# Patient Record
Sex: Male | Born: 1970
Health system: Southern US, Community
[De-identification: ages and names within clinical notes are randomized; demographics above are authoritative.]

## PROBLEM LIST (undated history)

## (undated) DIAGNOSIS — A6000 Herpesviral infection of urogenital system, unspecified: Secondary | ICD-10-CM

## (undated) DIAGNOSIS — F411 Generalized anxiety disorder: Secondary | ICD-10-CM

## (undated) HISTORY — PX: BACK SURGERY: SHX140

## (undated) HISTORY — PX: TONSILLECTOMY: SUR1361

## (undated) HISTORY — DX: Herpesviral infection of urogenital system, unspecified: A60.00

## (undated) HISTORY — PX: OTHER SURGICAL HISTORY: SHX169

## (undated) HISTORY — DX: Generalized anxiety disorder: F41.1

---

## 2005-12-03 ENCOUNTER — Emergency Department (HOSPITAL_COMMUNITY): Admission: EM | Admit: 2005-12-03 | Discharge: 2005-12-03 | Payer: Self-pay | Admitting: *Deleted

## 2006-11-25 ENCOUNTER — Emergency Department (HOSPITAL_COMMUNITY): Admission: EM | Admit: 2006-11-25 | Discharge: 2006-11-26 | Payer: Self-pay | Admitting: Emergency Medicine

## 2007-01-24 ENCOUNTER — Ambulatory Visit (HOSPITAL_COMMUNITY): Admission: RE | Admit: 2007-01-24 | Discharge: 2007-01-24 | Payer: Self-pay | Admitting: Gastroenterology

## 2007-01-24 ENCOUNTER — Encounter (INDEPENDENT_AMBULATORY_CARE_PROVIDER_SITE_OTHER): Payer: Self-pay | Admitting: Gastroenterology

## 2007-03-19 ENCOUNTER — Encounter: Admission: RE | Admit: 2007-03-19 | Discharge: 2007-03-19 | Payer: Self-pay | Admitting: Gastroenterology

## 2007-06-25 ENCOUNTER — Ambulatory Visit: Payer: Self-pay | Admitting: Internal Medicine

## 2007-08-04 ENCOUNTER — Ambulatory Visit (HOSPITAL_COMMUNITY): Admission: RE | Admit: 2007-08-04 | Discharge: 2007-08-04 | Payer: Self-pay | Admitting: Gastroenterology

## 2007-08-29 ENCOUNTER — Ambulatory Visit: Payer: Self-pay | Admitting: Internal Medicine

## 2007-08-29 DIAGNOSIS — F411 Generalized anxiety disorder: Secondary | ICD-10-CM | POA: Insufficient documentation

## 2007-10-09 ENCOUNTER — Ambulatory Visit: Payer: Self-pay | Admitting: Internal Medicine

## 2007-12-05 ENCOUNTER — Encounter: Payer: Self-pay | Admitting: Internal Medicine

## 2008-03-18 ENCOUNTER — Encounter: Payer: Self-pay | Admitting: Internal Medicine

## 2008-04-08 ENCOUNTER — Ambulatory Visit: Payer: Self-pay | Admitting: Internal Medicine

## 2008-06-14 ENCOUNTER — Telehealth: Payer: Self-pay | Admitting: Internal Medicine

## 2008-11-08 ENCOUNTER — Ambulatory Visit: Payer: Self-pay | Admitting: Internal Medicine

## 2008-12-13 ENCOUNTER — Ambulatory Visit: Payer: Self-pay | Admitting: Internal Medicine

## 2009-11-14 ENCOUNTER — Telehealth: Payer: Self-pay | Admitting: Internal Medicine

## 2009-12-15 ENCOUNTER — Telehealth: Payer: Self-pay | Admitting: Internal Medicine

## 2009-12-27 ENCOUNTER — Ambulatory Visit: Payer: Self-pay | Admitting: Internal Medicine

## 2010-01-10 ENCOUNTER — Ambulatory Visit: Payer: Self-pay | Admitting: Internal Medicine

## 2010-01-10 DIAGNOSIS — R300 Dysuria: Secondary | ICD-10-CM | POA: Insufficient documentation

## 2010-01-10 DIAGNOSIS — A6000 Herpesviral infection of urogenital system, unspecified: Secondary | ICD-10-CM | POA: Insufficient documentation

## 2010-01-10 LAB — CONVERTED CEMR LAB
Bilirubin Urine: NEGATIVE
Blood in Urine, dipstick: NEGATIVE
Glucose, Urine, Semiquant: NEGATIVE
Ketones, urine, test strip: NEGATIVE
Nitrite: NEGATIVE
Urobilinogen, UA: 0.2
pH: 6

## 2010-03-14 NOTE — Assessment & Plan Note (Signed)
Summary: consult re: painful urination for a few days/cjr   Vital Signs:  Patient profile:   40 year old male Weight:      166 pounds Temp:     98.1 degrees F oral BP sitting:   120 / 80  (left arm) Cuff size:   regular  Vitals Entered By: Duard Brady LPN (January 10, 2010 3:47 PM) CC: c/o dysuria Is Patient Diabetic? No   CC:  c/o dysuria.  History of Present Illness: 40 year old patient who presents with a week and a half history of paroxysms of burning and tingling mainly in the right perineal area and also the right groin and right buttock area.  He has a 13 year history of herpes simplex and has very mild outbreaks  once or twice per year.  He states these symptoms are very similar to his herpetic outbreaks.  There have been no vesicles, ulcers or other lesions.  Usually his herpes resolved after just a day or so and he was concerned because of the duration of a week and a half.  He has never had pain in the right buttock area.  No history of STDs other than herpes simplex.  He has been in a monogamous relationship with his wife for 8 years.  Preventive Screening-Counseling & Management  Alcohol-Tobacco     Smoking Status: never  Allergies: 1)  ! Clindamycin Hcl (Clindamycin Hcl) 2)  ! Amoxicillin  Physical Exam  General:  Well-developed,well-nourished,in no acute distress; alert,appropriate and cooperative throughout examination Abdomen:  Bowel sounds positive,abdomen soft and non-tender without masses, organomegaly or hernias noted. Genitalia:  Testes bilaterally descended without nodularity, tenderness or masses. No scrotal masses or lesions. No penis lesions or urethral discharge. Skin:  Intact without suspicious lesions or rashes   Impression & Recommendations:  Problem # 1:  DYSURIA (ICD-788.1)  Orders: UA Dipstick w/o Micro (manual) ([address])  Problem # 2:  HERPES GENITALIS (ICD-054.10)  Complete Medication List: 1)  Citalopram Hydrobromide 40 Mg Tabs  (Citalopram hydrobromide) .... Take one tablet by mouth daily--needs office visit  Patient Instructions: 1)  Please schedule an appointment with your primary doctor in :   Orders Added: 1)  UA Dipstick w/o Micro (manual) [81002] 2)  Est. Patient Level III [[address]]    Laboratory Results   Urine Tests  Date/Time Received: January 10, 2010 3:56 PM  Date/Time Reported: January 10, 2010 3:56 PM   Routine Urinalysis   Color: yellow Appearance: Clear Glucose: negative   (Normal Range: Negative) Bilirubin: negative   (Normal Range: Negative) Ketone: negative   (Normal Range: Negative) Spec. Gravity: 1.010   (Normal Range: 1.003-1.035) Blood: negative   (Normal Range: Negative) pH: 6.0   (Normal Range: 5.0-8.0) Protein: negative   (Normal Range: Negative) Urobilinogen: 0.2   (Normal Range: 0-1) Nitrite: negative   (Normal Range: Negative) Leukocyte Esterace: trace   (Normal Range: Negative)

## 2010-03-14 NOTE — Progress Notes (Signed)
Summary: REFILL CITALOPRAM  Phone Note Refill Request Message from:  Patient on November 14, 2009 3:21 PM  Refills Requested: Medication #1:  CITALOPRAM HYDROBROMIDE 40 MG TABS take one tablet by mouth daily--NEEDS OFFICE VISIT.   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    Initial call taken by: Debbra Riding,  November 14, 2009 3:22 PM    Prescriptions: CITALOPRAM HYDROBROMIDE 40 MG TABS (CITALOPRAM HYDROBROMIDE) take one tablet by mouth daily--NEEDS OFFICE VISIT  #30 x 0   Entered by:   Kathrynn Speed CMA   Authorized by:   Birdie Sons MD   Signed by:   Kathrynn Speed CMA on 11/14/2009   Method used:   Electronically to        [facility] [address] [id number]* (retail)       [address]       [place], [place]  [address]       Ph: [phone]       Fax: [phone]   RxID:   [id number]

## 2010-03-14 NOTE — Assessment & Plan Note (Signed)
Summary: fu on med/njr   Vital Signs:  Patient profile:   40 year old male Weight:      161 pounds BMI:     23.86 Temp:     98.5 degrees F oral Pulse rate:   58 / minute BP sitting:   122 / 80  (left arm) Cuff size:   regular  Vitals Entered By: Alfred Levins, CMA (December 27, 2009 8:23 AM) CC: renew med   CC:  renew med.  History of Present Illness:  Follow-Up Visit      This is a 40 year old man who presents for Follow-up visit.  The patient denies chest pain and palpitations.  Since the last visit the patient notes no new problems or concerns.  The patient reports taking meds as prescribed.  When questioned about possible medication side effects, the patient notes none.  feels well on citalopram  All other systems reviewed and were negative   Current Medications (verified): 1)  Citalopram Hydrobromide 40 Mg Tabs (Citalopram Hydrobromide) .... Take One Tablet By Mouth Daily--Needs Office Visit  Allergies (verified): 1)  ! Clindamycin Hcl (Clindamycin Hcl) 2)  ! Amoxicillin  Past History:  Past Medical History: Last updated: 08/29/2007 GERD Anxiety  Past Surgical History: Last updated: 06/25/2007 Tonsillectomy  Family History: Last updated: 06/25/2007 father alive and well mother alive and well   Social History: Last updated: 06/25/2007 Married Never Smoked Regular exercise-yes  Risk Factors: Exercise: yes (06/25/2007)  Risk Factors: Smoking Status: never (06/25/2007)  Physical Exam  General:  well-developed male in no acute distress. HEENT exam atraumatic, normocephalic, neck supple without lymphadenopathy or femoral. Cardiac exam S1-S2 are regular.   Impression & Recommendations:  Problem # 1:  ANXIETY (ICD-300.00) doing well on meds continue current medications  His updated medication list for this problem includes:    Citalopram Hydrobromide 40 Mg Tabs (Citalopram hydrobromide) .Marland Kitchen... Take one tablet by mouth daily--needs office  visit  Complete Medication List: 1)  Citalopram Hydrobromide 40 Mg Tabs (Citalopram hydrobromide) .... Take one tablet by mouth daily--needs office visit Prescriptions: CITALOPRAM HYDROBROMIDE 40 MG TABS (CITALOPRAM HYDROBROMIDE) take one tablet by mouth daily--NEEDS OFFICE VISIT  #90 x 3   Entered and Authorized by:   Birdie Sons MD   Signed by:   Birdie Sons MD on 12/27/2009   Method used:   Electronically to        [facility] [address] [id number]* (retail)       [address]       [place], [place]  [address]       Ph: [phone]       Fax: [phone]   RxID:   [id number]    Orders Added: 1)  Est. Patient Level II [[address]]

## 2010-03-14 NOTE — Progress Notes (Signed)
Summary: Pt sch ov for 12/27/09. Pt req refill of Citalopram  Phone Note Call from Patient Call back at Home Phone [phone]   Caller: Patient Summary of Call: Pt called and has an appt sch for 12/27/09 to see Dr. Cato Mulligan. Pt rea refill of Citalopram called in to [facility] on [address] and [address].   Initial call taken by: Lucy Antigua,  December 15, 2009 9:04 AM  Follow-up for Phone Call        Phone Call Completed, Rx Called In Follow-up by: Alfred Levins, CMA,  December 15, 2009 1:04 PM    Prescriptions: CITALOPRAM HYDROBROMIDE 40 MG TABS (CITALOPRAM HYDROBROMIDE) take one tablet by mouth daily--NEEDS OFFICE VISIT  #30 x 0   Entered by:   Alfred Levins, CMA   Authorized by:   Birdie Sons MD   Signed by:   Alfred Levins, CMA on 12/15/2009   Method used:   Electronically to        [facility] [address] [id number]* (retail)       [address]       [place], [place]  [address]       Ph: [phone]       Fax: [phone]   RxID:   [id number]

## 2010-06-27 NOTE — Op Note (Signed)
Craig Benson, Craig Benson                   ACCOUNT NO.:  [id number]   MEDICAL RECORD NO.:  [id number]          PATIENT TYPE:  AMB   LOCATION:  ENDO                         FACILITY:  [facility]   PHYSICIAN:  Anselmo Rod, M.D.  DATE OF BIRTH:  02/13/1970   DATE OF PROCEDURE:  01/24/2007  DATE OF DISCHARGE:  01/24/2007                               OPERATIVE REPORT   PROCEDURE PERFORMED:  Esophagogastroduodenoscopy with esophageal  brushings and gastric biopsies.   ENDOSCOPIST:  Anselmo Rod, M.D.   INSTRUMENT USED:  Pentax video panendoscope.   INDICATIONS FOR PROCEDURE:  A 40 year old white male with a history of  epigastric pain not responding to PPIs, undergoing EGD to rule out  peptic ulcer disease, esophagitis, gastritis, etc.   PREPROCEDURE PREPARATION:  Informed consent was procured from the  patient.  The patient fasted for 8 hours prior to the procedure.  Risks  and benefits of the procedure including a 10% miss rate of cancer and  polyp were discussed with the patient as well.   PREPROCEDURE PHYSICAL:  VITAL SIGNS:  The patient had stable vital  signs.  NECK:  Supple.  CHEST:  Clear to auscultation.  HEART:  S1 and S2 regular.  ABDOMEN:  Soft with normal bowel sounds.   DESCRIPTION OF PROCEDURE:  The patient was placed in the left lateral  decubitus position and sedated with 100 mcg of Fentanyl and 6 mg of  Versed given intravenously in slow incremental doses.  Once the patient  was adequately sedated and maintained on low-flow oxygen and continuous  cardiac monitoring, the Pentax video panendoscope was advanced through  the mouthpiece over the tongue into the esophagus and under direct  vision.  The entire esophagus was widely patent. There were whitish  exudates noted in the distal esophagus above the Z-line which could  represent esophageal candidiasis.  Brushings were done from this area  and there was no stricture noted.  The scope was then advanced into the  stomach.  Diffuse gastritis was noted. Antral biopsies were done to rule  out presence of H. pylori by pathology.  The proximal small bowel  appeared normal.  There was no outlet obstruction.  The patient  tolerated the procedure well without complications.  Retroflexion in the  high cardia revealed no abnormalities.   IMPRESSION:  1. Widely patent esophagus.  2. Whitish exudates in distal esophagus.  Brushings done to rule out      Candidal esophagitis.  3. Diffuse moderate gastritis.  Antral biopsies done to rule out      Helicobacter pylori.  4. Normal proximal small bowel.   RECOMMENDATIONS:  1. Diflucan has been called into the patient's pharmacy for the next      10 days, 100 mg per day.  2. Await pathology results.  3. Avoid all nonsteroidals.  4. Outpatient follow-up in the next four weeks for further      recommendations.      Anselmo Rod, M.D.  Electronically Signed     JNM/MEDQ  D:  01/24/2007  T:  01/26/2007  Job:  [id number]   cc:   Timoteo Gaul, M.D.  [facility] [employer]

## 2010-11-23 LAB — I-STAT 8, (EC8 V) (CONVERTED LAB)
BUN: 12
Bicarbonate: 30.7 — ABNORMAL HIGH
Chloride: 102
Glucose, Bld: 108 — ABNORMAL HIGH
HCT: 49
Sodium: 138
TCO2: 32
pCO2, Ven: 49.8
pH, Ven: 7.399 — ABNORMAL HIGH

## 2010-11-23 LAB — DIFFERENTIAL
Basophils Absolute: 0
Basophils Relative: 0
Lymphocytes Relative: 20
Monocytes Absolute: 0.6
Monocytes Relative: 7

## 2010-11-23 LAB — URINALYSIS, ROUTINE W REFLEX MICROSCOPIC
Glucose, UA: NEGATIVE
Ketones, ur: NEGATIVE

## 2010-11-23 LAB — CBC
HCT: 46.7
Hemoglobin: 16.1
RBC: 5.23

## 2011-01-30 ENCOUNTER — Other Ambulatory Visit: Payer: Self-pay | Admitting: Internal Medicine

## 2011-01-30 MED ORDER — CITALOPRAM HYDROBROMIDE 40 MG PO TABS: 40.0000 mg | ORAL_TABLET | Freq: Every day | ORAL | Status: DC

## 2011-01-30 NOTE — Telephone Encounter (Signed)
rx sent in electronically 

## 2011-01-30 NOTE — Telephone Encounter (Signed)
Pt scheduled for a med follow up on 03/06/11 and needs refill on CITALOPRAM HYDROBROMIDE 40 MG TABS (CITALOPRAM HYDROBROMIDE)  [facility] [address] and [address]

## 2011-03-06 ENCOUNTER — Ambulatory Visit: Payer: Self-pay | Admitting: Internal Medicine

## 2011-03-16 ENCOUNTER — Ambulatory Visit: Payer: Self-pay | Admitting: Internal Medicine

## 2011-03-28 ENCOUNTER — Telehealth: Payer: Self-pay | Admitting: Internal Medicine

## 2011-03-28 MED ORDER — CITALOPRAM HYDROBROMIDE 40 MG PO TABS: 40.0000 mg | ORAL_TABLET | Freq: Every day | ORAL | Status: DC

## 2011-03-28 NOTE — Telephone Encounter (Signed)
rx sent in electronically 

## 2011-03-28 NOTE — Telephone Encounter (Signed)
Pt had to rsc ov due to pcp being out of office. Pt has rschd for 04/12/11 at 8:00am. Pt is going to need a refill of citalopram (CELEXA) 40 MG tablet called in to [facility] at [address]

## 2011-03-29 ENCOUNTER — Ambulatory Visit: Payer: Self-pay | Admitting: Internal Medicine

## 2011-04-12 ENCOUNTER — Ambulatory Visit: Payer: Self-pay | Admitting: Internal Medicine

## 2011-05-16 ENCOUNTER — Telehealth: Payer: Self-pay | Admitting: Internal Medicine

## 2011-05-16 MED ORDER — CITALOPRAM HYDROBROMIDE 40 MG PO TABS: 40.0000 mg | ORAL_TABLET | Freq: Every day | ORAL | Status: DC

## 2011-05-16 NOTE — Telephone Encounter (Signed)
rx sent in electronically 

## 2011-05-16 NOTE — Telephone Encounter (Signed)
Pt called and has sch an ov to see Dr Cato Mulligan on 06/18/11 at 9am. Pt is req refill of citalopram (CELEXA) 40 MG tablet to last until appt date.

## 2011-06-18 ENCOUNTER — Ambulatory Visit: Payer: Self-pay | Admitting: Internal Medicine

## 2011-07-23 ENCOUNTER — Ambulatory Visit (INDEPENDENT_AMBULATORY_CARE_PROVIDER_SITE_OTHER): Payer: BC Managed Care – PPO | Admitting: Internal Medicine

## 2011-07-23 ENCOUNTER — Encounter: Payer: Self-pay | Admitting: Internal Medicine

## 2011-07-23 VITALS — BP 128/90 | HR 64 | Temp 98.2°F | Ht 68.0 in | Wt 162.0 lb

## 2011-07-23 DIAGNOSIS — F411 Generalized anxiety disorder: Secondary | ICD-10-CM

## 2011-07-23 MED ORDER — CITALOPRAM HYDROBROMIDE 40 MG PO TABS: 40.0000 mg | ORAL_TABLET | Freq: Every day | ORAL | Status: DC

## 2011-07-23 NOTE — Assessment & Plan Note (Signed)
Continue current meds Side effects discussed

## 2011-07-23 NOTE — Progress Notes (Signed)
Patient ID: Craig Benson, male   DOB: 1970-08-16, 41 y.o.   MRN: [id number]  Feels well no complaints- Wants refill of citalopram- no side effects Anxiety well controlled  Reviewed meds and allergies   well-developed well-nourished male in no acute distress. HEENT exam atraumatic, normocephalic, neck supple without jugular venous distention. Affect normal

## 2012-07-23 ENCOUNTER — Other Ambulatory Visit: Payer: Self-pay | Admitting: *Deleted

## 2012-07-23 MED ORDER — CITALOPRAM HYDROBROMIDE 40 MG PO TABS: 40.0000 mg | ORAL_TABLET | Freq: Every day | ORAL | Status: DC

## 2012-11-14 ENCOUNTER — Other Ambulatory Visit: Payer: Self-pay | Admitting: Internal Medicine

## 2012-11-17 ENCOUNTER — Telehealth: Payer: Self-pay | Admitting: Internal Medicine

## 2012-11-17 MED ORDER — CITALOPRAM HYDROBROMIDE 40 MG PO TABS: ORAL_TABLET | ORAL | Status: DC

## 2012-11-17 NOTE — Telephone Encounter (Signed)
Pt has appt on 01-13-13. Pt needs refill on citalopram 40 mg call [facility] [address] in [place].

## 2012-11-17 NOTE — Telephone Encounter (Signed)
rx sent in electronically 

## 2013-01-13 ENCOUNTER — Ambulatory Visit: Payer: BC Managed Care – PPO | Admitting: Internal Medicine

## 2013-01-21 ENCOUNTER — Ambulatory Visit: Payer: BC Managed Care – PPO | Admitting: Internal Medicine

## 2013-01-29 ENCOUNTER — Encounter: Payer: Self-pay | Admitting: Internal Medicine

## 2013-01-29 ENCOUNTER — Ambulatory Visit (INDEPENDENT_AMBULATORY_CARE_PROVIDER_SITE_OTHER): Payer: BC Managed Care – PPO | Admitting: Internal Medicine

## 2013-01-29 VITALS — BP 130/80 | HR 58 | Temp 98.0°F | Ht 69.0 in | Wt 166.0 lb

## 2013-01-29 DIAGNOSIS — H9190 Unspecified hearing loss, unspecified ear: Secondary | ICD-10-CM

## 2013-01-29 DIAGNOSIS — Z0000 Encounter for general adult medical examination without abnormal findings: Secondary | ICD-10-CM

## 2013-01-29 DIAGNOSIS — F411 Generalized anxiety disorder: Secondary | ICD-10-CM

## 2013-01-29 DIAGNOSIS — H9193 Unspecified hearing loss, bilateral: Secondary | ICD-10-CM

## 2013-01-29 DIAGNOSIS — H109 Unspecified conjunctivitis: Secondary | ICD-10-CM

## 2013-01-29 MED ORDER — TOBRAMYCIN 0.3 % OP SOLN: 1.0000 [drp] | Freq: Four times a day (QID) | OPHTHALMIC | Status: DC

## 2013-01-29 MED ORDER — CITALOPRAM HYDROBROMIDE 40 MG PO TABS: ORAL_TABLET | ORAL | Status: DC

## 2013-01-29 NOTE — Progress Notes (Signed)
   Subjective:    Patient ID: Craig Benson, male    DOB: 04-19-1970, 42 y.o.   MRN: [id number]  HPI  Here with acute onset mild left eye conjunctival erythema with watery/cloudy d/c, low grade fever but no vision change, eye pain, HA, neck pain, ST, cough and Pt denies chest pain, increased sob or doe, wheezing, orthopnea, PND, increased LE swelling, palpitations, dizziness or syncope.  Pt denies new neurological symptoms such as new headache, or facial or extremity weakness or numbness   Pt denies polydipsia, polyuria. Denies worsening depressive symptoms, suicidal ideation, or panic; has ongoing anxiety, and now out of citalopram, asks for refill as has not been able to see PCP recently.  Also incidentally with bilat hearing loss mild in the past wk, not better with decongestant. Past Medical History  Diagnosis Date  . Depression    Past Surgical History  Procedure Laterality Date  . Tonsillectomy      reports that he has never smoked. He does not have any smokeless tobacco history on file. He reports that he drinks alcohol. He reports that he does not use illicit drugs. family history is not on file. Allergies  Allergen Reactions  . Amoxicillin     REACTION: rash  . Clindamycin Hcl     REACTION: rash   No current outpatient prescriptions on file prior to visit.   No current facility-administered medications on file prior to visit.   Review of Systems  Constitutional: Negative for unexpected weight change, or unusual diaphoresis  HENT: Negative for tinnitus.   Eyes: Negative for photophobia and visual disturbance.  Respiratory: Negative for choking and stridor.   Gastrointestinal: Negative for vomiting and blood in stool.  Genitourinary: Negative for hematuria and decreased urine volume.  Musculoskeletal: Negative for acute joint swelling Skin: Negative for color change and wound.  Neurological: Negative for tremors and numbness other than noted  Psychiatric/Behavioral: Negative for  decreased concentration or  hyperactivity.       Objective:   Physical Exam BP 130/80  Pulse 58  Temp(Src) 98 F (36.7 C) (Oral)  Ht 5\' 9"  (1.753 m)  Wt 166 lb (75.297 kg)  BMI 24.50 kg/m2  SpO2 97% VS noted,  Constitutional: Pt appears well-developed and well-nourished.  HENT: Head: NCAT.  Right Ear: External ear normal.  Left Ear: External ear normal.  Eyes: left Conjunctivae marked erythema with mild cloudy d/c , right non-erythema, and EOM are normal. Pupils are equal, round, and reactive to light.  TM's clear after bilat wax impactions irrigated/removed Neck: Normal range of motion. Neck supple.  Cardiovascular: Normal rate and regular rhythm.   Pulmonary/Chest: Effort normal and breath sounds normal.  Neurological: Pt is alert. Not confused  Skin: Skin is warm. No erythema.  Psychiatric: Pt behavior is normal. Thought content normal. 1+ nervous    Assessment & Plan:

## 2013-01-29 NOTE — Patient Instructions (Addendum)
Please take all new medication as prescribed - the antibiotic drop for the eyes Please continue all other medications as before, and refills have been done if requested - the citalopram Your ears were irrigated of wax today  Please remember to sign up for My Chart if you have not done so, as this will be important to you in the future with finding out test results, communicating by private email, and scheduling acute appointments online when needed.  Please call Dr Cato Mulligan office to request a Physician change if this is what you would like to do  Please return in 3 months, or sooner if needed, with Lab testing done 3-5 days before

## 2013-02-04 DIAGNOSIS — H109 Unspecified conjunctivitis: Secondary | ICD-10-CM | POA: Insufficient documentation

## 2013-02-04 DIAGNOSIS — H9193 Unspecified hearing loss, bilateral: Secondary | ICD-10-CM | POA: Insufficient documentation

## 2013-02-04 NOTE — Assessment & Plan Note (Signed)
Chronic stable, for citalopram refill,  to f/u any worsening symptoms or concerns

## 2013-02-04 NOTE — Assessment & Plan Note (Signed)
Mild to mod, for antibx course,  to f/u any worsening symptoms or concerns 

## 2013-02-04 NOTE — Assessment & Plan Note (Signed)
Resolved with bilat wax impaction irrigations

## 2014-03-01 ENCOUNTER — Other Ambulatory Visit: Payer: Self-pay | Admitting: Internal Medicine

## 2014-03-04 ENCOUNTER — Telehealth: Payer: Self-pay | Admitting: Internal Medicine

## 2014-03-04 MED ORDER — CITALOPRAM HYDROBROMIDE 40 MG PO TABS: ORAL_TABLET | ORAL | Status: DC

## 2014-03-04 NOTE — Telephone Encounter (Signed)
Patient Name: Craig GoltzSEAN Benson  DOB: 02/11/1971    Initial Comment Caller states he needs Rx refill of Citalopram, will be out tomorrow and appt isn't until 03/11/14, not currently having any symptoms   Nurse Assessment  Nurse: Stefano GaulStringer, RN, Dwana CurdVera Date/Time (Eastern Time): 03/04/2014 12:23:07 PM  Confirm and document reason for call. If symptomatic, describe symptoms. ---Caller states he will be out of his citalopram 40 mg qd. tomorrow. His appt is next 03/11/14. The pharmacy has sent the request and it was denied.  Has the patient traveled out of the country within the last 30 days? ---Not Applicable  Does the patient require triage? ---No  Please document clinical information provided and list any resource used. ---Called back line at office and spoke to WinslowAmanda who said that pt has already been informed that no citalopram will be called into the pharmacy until he see the doctor next Thursday. Pt notified and verbalized understanding.

## 2014-03-04 NOTE — Telephone Encounter (Signed)
Patient called to get a re-fill on his citalopram (CELEXA) 40 MG tablet.  He is scheduled to establish with Dr. Durene CalHunter in June and a med check appointment on 03/11/14.  He states he has only 2 pills left.  He asked to be transferred to a triage nurse to get further assistance on what to do til he is seen next week.  I didn't verify patient's pharmacy before transferring.

## 2014-03-04 NOTE — Telephone Encounter (Signed)
Just an FYI Dr. Durene CalHunter

## 2014-03-04 NOTE — Telephone Encounter (Signed)
Sent in rx. Called patient and informed him and advised him strongly that he still needs to keep appointment next week.

## 2014-03-11 ENCOUNTER — Encounter: Payer: Self-pay | Admitting: Family Medicine

## 2014-03-11 ENCOUNTER — Ambulatory Visit (INDEPENDENT_AMBULATORY_CARE_PROVIDER_SITE_OTHER): Payer: BLUE CROSS/BLUE SHIELD | Admitting: Family Medicine

## 2014-03-11 VITALS — BP 120/82 | Temp 98.3°F | Wt 166.0 lb

## 2014-03-11 DIAGNOSIS — F411 Generalized anxiety disorder: Secondary | ICD-10-CM

## 2014-03-11 NOTE — Assessment & Plan Note (Signed)
Well controlled with no panic attack in years. Generalized anxiety also well controlled. Discussed likely would benefit form long term therapy. 1 year refill provided recently. Follow up in 1 year for CPE and check labs at that time.

## 2014-03-11 NOTE — Progress Notes (Signed)
  Craig ConchStephen Aviana Shevlin, MD Phone: [phone]  Subjective:   Craig Benson is a 44 y.o. year old very pleasant male patient who presents with the following:  Generalized anxiety disorder Had panic attacks many years ago and started on citalopram around 2010. Going out to eat lunch and would not want to get up from the table. Also has generalized anxiety/worry fear which seems to be controlled with medicine ROS- no sexual dysfunction/weight gain. No SI/HI Left wrist pain after recent surgery for ligament damage after obstacle course race under care of [facility] Nedra Benson [facility].   Past Medical History- Patient Active Problem List   Diagnosis Date Noted  . Genital herpes 01/10/2010    Priority: Medium  . GAD (generalized anxiety disorder) also with history panic attacks around 2010 08/29/2007    Priority: Medium   Medications- reviewed and updated Current Outpatient Prescriptions  Medication Sig Dispense Refill  . citalopram (CELEXA) 40 MG tablet TAKE 1 TABLET BY MOUTH EVERY DAY 90 tablet 3   No current facility-administered medications for this visit.    Objective: BP 120/82 mmHg  Temp(Src) 98.3 F (36.8 C)  Wt 166 lb (75.297 kg) Gen: NAD, resting comfortably in chair CV: RRR no murmurs rubs or gallops Lungs: CTAB no crackles, wheeze, rhonchi Abdomen: soft/nontender/nondistended/normal bowel sounds.  Ext: no edema, left arm in cast from recent surgery Skin: warm, dry Neuro: grossly normal, moves all extremities Psych not anxious appearing  Assessment/Plan:  GAD (generalized anxiety disorder) also with history panic attacks around 2010 Well controlled with no panic attack in years. Generalized anxiety also well controlled. Discussed likely would benefit form long term therapy. 1 year refill provided recently. Follow up in 1 year for CPE and check labs at that time.    declines tdap today-agrees to next year. We reviewed his full history today and does not need another establish visit.

## 2014-03-11 NOTE — Patient Instructions (Addendum)
Refilled celexa, glad you are doing well. See you back in a year unless you need us sooner.   Health Maintenance Due  Topic Date Due  . Janet BerlinETANUS/TDAP -offered today, you agreed to next year 01/19/1990

## 2014-07-14 ENCOUNTER — Ambulatory Visit: Payer: Self-pay | Admitting: Family Medicine

## 2015-03-09 ENCOUNTER — Telehealth: Payer: Self-pay | Admitting: Family Medicine

## 2015-03-09 MED ORDER — CITALOPRAM HYDROBROMIDE 40 MG PO TABS: ORAL_TABLET | ORAL | Status: DC

## 2015-03-09 NOTE — Telephone Encounter (Signed)
Medication sent in. 

## 2015-03-09 NOTE — Telephone Encounter (Signed)
Patient would like his Citalopram prescription refilled until his 04/13/15 appointment with Dr. Durene Cal.

## 2015-04-13 ENCOUNTER — Ambulatory Visit: Payer: BLUE CROSS/BLUE SHIELD | Admitting: Family Medicine

## 2015-06-07 ENCOUNTER — Other Ambulatory Visit: Payer: Self-pay | Admitting: Family Medicine

## 2015-06-07 NOTE — Telephone Encounter (Signed)
May give #30 if that will get him to appointment

## 2015-06-07 NOTE — Telephone Encounter (Signed)
Pt was supposed to have his appointment in 04/2015 but did not follow through, it looks like he is now scheduled in 06/2015. Please advise about refill.

## 2015-06-23 ENCOUNTER — Ambulatory Visit: Payer: BLUE CROSS/BLUE SHIELD | Admitting: Family Medicine

## 2016-03-20 ENCOUNTER — Other Ambulatory Visit: Payer: Self-pay | Admitting: Family Medicine

## 2016-03-21 ENCOUNTER — Other Ambulatory Visit: Payer: Self-pay | Admitting: Family Medicine

## 2016-03-27 ENCOUNTER — Ambulatory Visit (INDEPENDENT_AMBULATORY_CARE_PROVIDER_SITE_OTHER): Payer: BLUE CROSS/BLUE SHIELD | Admitting: Family Medicine

## 2016-03-27 ENCOUNTER — Encounter: Payer: Self-pay | Admitting: Family Medicine

## 2016-03-27 VITALS — BP 128/86 | HR 86 | Temp 98.6°F | Ht 69.0 in | Wt 171.6 lb

## 2016-03-27 DIAGNOSIS — Z23 Encounter for immunization: Secondary | ICD-10-CM

## 2016-03-27 DIAGNOSIS — M25562 Pain in left knee: Secondary | ICD-10-CM

## 2016-03-27 DIAGNOSIS — F411 Generalized anxiety disorder: Secondary | ICD-10-CM | POA: Diagnosis not present

## 2016-03-27 MED ORDER — CITALOPRAM HYDROBROMIDE 40 MG PO TABS: 40.0000 mg | ORAL_TABLET | Freq: Every day | ORAL | 3 refills | Status: DC

## 2016-03-27 NOTE — Progress Notes (Signed)
Subjective:  Craig GoltzSean Matheney is a 46 y.o. year old very pleasant male patient who presents for/with See problem oriented charting ROS- no SI. No chest pain. No leg swelling. No redness around knee- had some swelling but this has now resolved.    Past Medical History-  Patient Active Problem List   Diagnosis Date Noted  . Genital herpes 01/10/2010    Priority: Medium  . GAD (generalized anxiety disorder) also with history panic attacks around 2010 08/29/2007    Priority: Medium    Medications- reviewed and updated Current Outpatient Prescriptions  Medication Sig Dispense Refill  . citalopram (CELEXA) 40 MG tablet Take 1 tablet (40 mg total) by mouth daily. 90 tablet 3   No current facility-administered medications for this visit.     Objective: BP 128/86   Pulse 86   Temp 98.6 F (37 C) (Oral)   Ht 5\' 9"  (1.753 m)   Wt 171 lb 9.6 oz (77.8 kg)   SpO2 98%   BMI 25.34 kg/m  Gen: NAD, resting comfortably CV: RRR no murmurs rubs or gallops Lungs: CTAB no crackles, wheeze, rhonchi.  Ext: no edema Skin: warm, dry  Left Knee: Normal to inspection with no erythema or obvious bony abnormalities. Minimal edema on medial side of knee.  Palpation normal with no warmth or joint line tenderness laterally- but does have medial joint line tenderness. no patellar tenderness or condyle tenderness. ROM normal in flexion and extension and lower leg rotation. Ligaments with solid consistent endpoints including ACL, PCL, LCL, MCL. Positive Mcmurray's for significant pain but no clicking felt by examiner Patellar and quadriceps tendons unremarkable. Hamstring and quadriceps strength is normal.   Assessment/Plan:  GAD (generalized anxiety disorder) also with history panic attacks around 2010 S: GAD has been improved with celexa 40mg  daily. Last seen 03/11/14. Have refilled meds with plans for upcoming appointments but each have been cancelled. No panic attacks in this time frame. No SI.  A/P:  excellent control. Agreed to refill 1 year- encouraged at least yearly follow up. Discussed SI risk.   Left knee pain S: left knee has been having pain with squatting down. 2 weeks ago running on treadmill and pain increased significantly and "gave out"- had to stop running. Has had soreness more on inside of the knee. Rode bicycle today and no issues. If moves knee inward it hurts. No locking, clicking, popping when squatting down. No medicine tried. Has tried some stretching and backed down on running- and that has helped. 3-4/10 with walking, close to 10 that day he was running. No history knee injury. Has had IT band issues on right side in past. Very active. Has not tried icing A/P: suspect left meniscus injury medially- he is already plugged in with sports medicine in [place]- he agrees to follow up. Offered sports medicine referral locally but lives in [place] and [place] in [place] so not convenient for him.   Given Orders Placed This Encounter  Procedures  . Tdap vaccine greater than or equal to 7yo IM    Meds ordered this encounter  Medications  . citalopram (CELEXA) 40 MG tablet    Sig: Take 1 tablet (40 mg total) by mouth daily.    Dispense:  90 tablet    Refill:  3    Return precautions advised.  Tana ConchStephen Will Heinkel, MD

## 2016-03-27 NOTE — Patient Instructions (Addendum)
Tdap today  Refilled celexa for a year  Would see your orthopedist- keep me updated- suspect medial meniscus injury

## 2016-03-27 NOTE — Assessment & Plan Note (Signed)
S: GAD has been improved with celexa 40mg  daily. Last seen 03/11/14. Have refilled meds with plans for upcoming appointments but each have been cancelled. No panic attacks in this time frame. No SI.  A/P: excellent control. Agreed to refill 1 year- encouraged at least yearly follow up. Discussed SI risk.

## 2016-03-27 NOTE — Progress Notes (Signed)
Pre visit review using our clinic review tool, if applicable. No additional management support is needed unless otherwise documented below in the visit note. 

## 2016-04-03 DIAGNOSIS — M25562 Pain in left knee: Secondary | ICD-10-CM | POA: Diagnosis not present

## 2017-01-17 DIAGNOSIS — M25562 Pain in left knee: Secondary | ICD-10-CM | POA: Diagnosis not present

## 2017-02-14 DIAGNOSIS — M25462 Effusion, left knee: Secondary | ICD-10-CM | POA: Diagnosis not present

## 2017-02-14 DIAGNOSIS — M67462 Ganglion, left knee: Secondary | ICD-10-CM | POA: Diagnosis not present

## 2017-02-14 DIAGNOSIS — S83232A Complex tear of medial meniscus, current injury, left knee, initial encounter: Secondary | ICD-10-CM | POA: Diagnosis not present

## 2017-02-14 DIAGNOSIS — M25562 Pain in left knee: Secondary | ICD-10-CM | POA: Diagnosis not present

## 2017-02-28 DIAGNOSIS — M25562 Pain in left knee: Secondary | ICD-10-CM | POA: Diagnosis not present

## 2017-03-14 ENCOUNTER — Other Ambulatory Visit: Payer: Self-pay

## 2017-03-14 ENCOUNTER — Emergency Department (HOSPITAL_BASED_OUTPATIENT_CLINIC_OR_DEPARTMENT_OTHER)
Admission: EM | Admit: 2017-03-14 | Discharge: 2017-03-14 | Disposition: A | Discharge status: Home / Self Care | Payer: BLUE CROSS/BLUE SHIELD | Attending: Emergency Medicine | Admitting: Emergency Medicine

## 2017-03-14 ENCOUNTER — Encounter (HOSPITAL_BASED_OUTPATIENT_CLINIC_OR_DEPARTMENT_OTHER): Payer: Self-pay | Admitting: *Deleted

## 2017-03-14 ENCOUNTER — Emergency Department (HOSPITAL_BASED_OUTPATIENT_CLINIC_OR_DEPARTMENT_OTHER): Payer: BLUE CROSS/BLUE SHIELD

## 2017-03-14 DIAGNOSIS — X501XXA Overexertion from prolonged static or awkward postures, initial encounter: Secondary | ICD-10-CM | POA: Diagnosis not present

## 2017-03-14 DIAGNOSIS — Y929 Unspecified place or not applicable: Secondary | ICD-10-CM | POA: Diagnosis not present

## 2017-03-14 DIAGNOSIS — S63502A Unspecified sprain of left wrist, initial encounter: Secondary | ICD-10-CM | POA: Insufficient documentation

## 2017-03-14 DIAGNOSIS — Y9389 Activity, other specified: Secondary | ICD-10-CM | POA: Diagnosis not present

## 2017-03-14 DIAGNOSIS — S6992XA Unspecified injury of left wrist, hand and finger(s), initial encounter: Secondary | ICD-10-CM | POA: Diagnosis not present

## 2017-03-14 DIAGNOSIS — Y999 Unspecified external cause status: Secondary | ICD-10-CM | POA: Diagnosis not present

## 2017-03-14 DIAGNOSIS — Z79899 Other long term (current) drug therapy: Secondary | ICD-10-CM | POA: Insufficient documentation

## 2017-03-14 MED ORDER — KETOROLAC TROMETHAMINE 60 MG/2ML IM SOLN
60.0000 mg | Freq: Once | INTRAMUSCULAR | Status: AC
  Administered 2017-03-14: 60 mg via INTRAMUSCULAR
  Filled 2017-03-14: qty 2

## 2017-03-14 MED ORDER — TRAMADOL HCL 50 MG PO TABS
50.0000 mg | ORAL_TABLET | Freq: Once | ORAL | Status: AC
  Administered 2017-03-14: 50 mg via ORAL
  Filled 2017-03-14: qty 1

## 2017-03-14 MED ORDER — ACETAMINOPHEN 500 MG PO TABS
1000.0000 mg | ORAL_TABLET | Freq: Once | ORAL | Status: AC
  Administered 2017-03-14: 1000 mg via ORAL
  Filled 2017-03-14: qty 2

## 2017-03-14 NOTE — ED Provider Notes (Signed)
[facility] [facility] EMERGENCY DEPARTMENT Provider Note   CSN: [id number] Arrival date & time: 03/14/17  0700     History   Chief Complaint Chief Complaint  Patient presents with  . Wrist Pain    HPI Craig Benson is a 47 y.o. male with a history of left wrist surgical reconstruction in 04/2016 presenting for acute left wrist pain. He reports twisting his wrist as he attempted to put on a backpack yesterday with resulting immediate pain. Swelling over the wrist worsened overnight, prompting him to come in the the ED to be evaluated. He has not had any fevers.    Past Medical History:  Diagnosis Date  . GAD (generalized anxiety disorder)   . Genital herpes     Patient Active Problem List   Diagnosis Date Noted  . Genital herpes 01/10/2010  . GAD (generalized anxiety disorder) also with history panic attacks around 2010 08/29/2007    Past Surgical History:  Procedure Laterality Date  . left wrist surgery     ligmant surgery after obstacle course race  . TONSILLECTOMY     child       Home Medications    Prior to Admission medications   Medication Sig Start Date End Date Taking? Authorizing Provider  citalopram (CELEXA) 40 MG tablet Take 1 tablet (40 mg total) by mouth daily. 03/27/16   Shelva MajesticHunter, Stephen O, MD    Family History Family History  Problem Relation Age of Onset  . Leukemia Brother        childhood  . Testicular cancer Brother        chilldhood    Social History Social History   Tobacco Use  . Smoking status: Never Smoker  . Smokeless tobacco: Never Used  Substance Use Topics  . Alcohol use: Yes    Alcohol/week: 2.4 oz    Types: 4 Standard drinks or equivalent per week  . Drug use: No     Allergies   Amoxicillin and Clindamycin hcl   Review of Systems Review of Systems   Physical Exam Updated Vital Signs BP (!) 159/99 (BP Location: Right Arm)   Pulse 99   Temp 99.5 F (37.5 C) (Oral)   Resp 18   Ht 5\' 8"  (1.727 m)   Wt 72.6 kg  (160 lb)   SpO2 99%   BMI 24.33 kg/m   Physical Exam  Constitutional: He is oriented to person, place, and time. He appears well-developed and well-nourished.  HENT:  Head: Normocephalic and atraumatic.  Eyes: Pupils are equal, round, and reactive to light.  Neck: Neck supple.  Cardiovascular: Normal rate and regular rhythm.  Pulmonary/Chest: Effort normal and breath sounds normal.  Abdominal: Soft.  Musculoskeletal:  +erythematous edema over left wrist under previous surgical scar. No puncture wound. +distal sensation, pulses, muscle strength intact. No fluctuance or surrounding erythema. Limited ROM at the wrist due to pain.   Neurological: He is alert and oriented to person, place, and time.  Skin: Skin is warm and dry. Capillary refill takes less than 2 seconds.  Psychiatric: He has a normal mood and affect.     ED Treatments / Results  Labs (all labs ordered are listed, but only abnormal results are displayed) Labs Reviewed - No data to display  EKG  EKG Interpretation None       Radiology Dg Wrist Complete Left  Result Date: 03/14/2017 CLINICAL DATA:  Injured wrist.  Prior history of wrist surgery. EXAM: LEFT WRIST - COMPLETE 3+ VIEW COMPARISON:  None. FINDINGS: Small bone pledgets noted in the scaphoid and lunate bones likely from a scapholunate ligament repair. The joint space appears mildly widened. Possible early settling of the capitate between the scaphoid and lunate bones. The radiocarpal joint space is maintained. No acute bony findings. IMPRESSION: No acute bony findings. Evidence of prior surgery likely from scapholunate ligament repair. The scapholunate joint space appears mildly widened and there appears to be early settling of the capitate. Electronically Signed   By: Rudie Meyer M.D.   On: 03/14/2017 08:11    Procedures Procedures (including critical care time)  Medications Ordered in ED Medications  acetaminophen (TYLENOL) tablet 1,000 mg (1,000 mg  Oral Given 03/14/17 0750)  traMADol (ULTRAM) tablet 50 mg (50 mg Oral Given 03/14/17 0749)  ketorolac (TORADOL) injection 60 mg (60 mg Intramuscular Given 03/14/17 0752)     Initial Impression / Assessment and Plan / ED Course  I have reviewed the triage vital signs and the nursing notes.  Pertinent labs & imaging results that were available during my care of the patient were reviewed by me and considered in my medical decision making (see chart for details).    47 yo with history of reconstructive wrist surgery 1 year ago presenting with acute wrist pain after twisting injury. Wrist swelling is not consistent with abscess as there are no fevers, fluctuance, or history of IV drug use. [place] controlled substance database reviewed with no red flags. Additionally, edema does not involve the entire joint and does not seem consistent with septic joint. Xray was negative for acute bony abnormality.    Given that sensation and pulses are intact, patient is considered safe for discharge to follow up with his orthopedic doctor. Return precautions were provided. Advised to use ibuprofen and tylenol for pain.  Final Clinical Impressions(s) / ED Diagnoses   Final diagnoses:  Sprain of left wrist, initial encounter    ED Discharge Orders    None       Howard Pouch, MD 03/14/17 [id number]    Alvira Monday, MD 03/15/17 [phone]

## 2017-03-14 NOTE — ED Triage Notes (Signed)
Pt reports sudden onset of pain and swelling to left wrist while putting on his backpack yesterday around 4pm. Cont with pain and swelling today. + cms, good radial pulse, swelling and tenderness noted to top of wrist/hand area.

## 2017-03-14 NOTE — Discharge Instructions (Signed)
You were seen in the emergency department for your wrist pain.   Please use tylenol and ibuprofen at home for pain. Please schedule an appointment to follow up with your orthopedic doctor as soon as possible.

## 2017-03-20 DIAGNOSIS — Z9889 Other specified postprocedural states: Secondary | ICD-10-CM | POA: Diagnosis not present

## 2017-03-20 DIAGNOSIS — Z87828 Personal history of other (healed) physical injury and trauma: Secondary | ICD-10-CM | POA: Diagnosis not present

## 2017-03-20 DIAGNOSIS — S6992XD Unspecified injury of left wrist, hand and finger(s), subsequent encounter: Secondary | ICD-10-CM | POA: Diagnosis not present

## 2017-04-04 ENCOUNTER — Other Ambulatory Visit: Payer: Self-pay | Admitting: Family Medicine

## 2017-07-07 ENCOUNTER — Other Ambulatory Visit: Payer: Self-pay | Admitting: Family Medicine

## 2017-08-06 ENCOUNTER — Other Ambulatory Visit: Payer: Self-pay | Admitting: Family Medicine

## 2017-08-20 ENCOUNTER — Ambulatory Visit: Payer: BLUE CROSS/BLUE SHIELD | Admitting: Family Medicine

## 2017-08-20 ENCOUNTER — Encounter: Payer: Self-pay | Admitting: Family Medicine

## 2017-08-20 DIAGNOSIS — F411 Generalized anxiety disorder: Secondary | ICD-10-CM

## 2017-08-20 MED ORDER — CITALOPRAM HYDROBROMIDE 40 MG PO TABS: 40.0000 mg | ORAL_TABLET | Freq: Every day | ORAL | 2 refills | Status: DC

## 2017-08-20 NOTE — Assessment & Plan Note (Signed)
S: no recent issues on celexa 40mg . No Si. No thoughts of self harm. No anxiety attacks.  GAD 7 : Generalized Anxiety Score 08/20/2017  Nervous, Anxious, on Edge 1  Control/stop worrying 0  Worry too much - different things 1  Trouble relaxing 0  Restless 0  Easily annoyed or irritable 0  Afraid - awful might happen 0  Total GAD 7 Score 2  Anxiety Difficulty Not difficult at all  A/P: continue current rx- doing well. Refilled for 9 months- encouraged CPE within 6-9 months

## 2017-08-20 NOTE — Patient Instructions (Addendum)
Health Maintenance Due  Topic Date Due  . HIV Screening - Patient Declined 01[birth year]19/1986   Glad anxiety is doing so well- lets plan on a physical in 6-9 months. Would love if you would go ahead and schedule that before you leave. Come fasting and we can update bloodwork (last ordered 2014 and not completed so we need to update this)

## 2017-08-20 NOTE — Progress Notes (Signed)
Subjective:  Craig GoltzSean Benson is a 47 y.o. year old very pleasant male patient who presents for/with See problem oriented charting ROS- No chest pain or shortness of breath. No headache or blurry vision.   Past Medical History-  Patient Active Problem List   Diagnosis Date Noted  . Genital herpes 01/10/2010    Priority: Medium  . GAD (generalized anxiety disorder) also with history panic attacks around 2010 08/29/2007    Priority: Medium    Medications- reviewed and updated Current Outpatient Medications  Medication Sig Dispense Refill  . citalopram (CELEXA) 40 MG tablet Take 1 tablet (40 mg total) by mouth daily. 90 tablet 2   No current facility-administered medications for this visit.     Objective: BP 124/86 (BP Location: Left Arm, Patient Position: Sitting, Cuff Size: Normal)   Pulse 66   Temp 98.3 F (36.8 C) (Oral)   Ht 5\' 8"  (1.727 m)   Wt 160 lb (72.6 kg)   SpO2 97%   BMI 24.33 kg/m  Gen: NAD, resting comfortably CV: RRR no murmurs rubs or gallops Lungs: CTAB no crackles, wheeze, rhonchi Abdomen: soft/nontender/nondistended/normal bowel sounds. No rebound or guarding.  Ext: no edema Skin: warm, dry  Assessment/Plan:  GAD (generalized anxiety disorder) also with history panic attacks around 2010 S: no recent issues on celexa 40mg . No Si. No thoughts of self harm. No anxiety attacks.  GAD 7 : Generalized Anxiety Score 08/20/2017  Nervous, Anxious, on Edge 1  Control/stop worrying 0  Worry too much - different things 1  Trouble relaxing 0  Restless 0  Easily annoyed or irritable 0  Afraid - awful might happen 0  Total GAD 7 Score 2  Anxiety Difficulty Not difficult at all  A/P: continue current rx- doing well. Refilled for 9 months- encouraged CPE within 6-9 months   Future Appointments  Date Time Provider Department Center  05/22/2018  8:15 AM Shelva MajesticHunter, Lelah Rennaker O, MD [facility]    Meds ordered this encounter  Medications  . citalopram (CELEXA) 40 MG tablet    Sig: Take 1 tablet (40 mg total) by mouth daily.    Dispense:  90 tablet    Refill:  2    Return precautions advised.  Tana ConchStephen Donyell Carrell, MD

## 2017-09-07 ENCOUNTER — Other Ambulatory Visit: Payer: Self-pay | Admitting: Family Medicine

## 2018-05-19 ENCOUNTER — Telehealth: Payer: Self-pay | Admitting: Family Medicine

## 2018-05-19 NOTE — Telephone Encounter (Signed)
See note, patient requested to cancel 4/9 physical appointment also. Maddy is contacting to reschedule for 3 months out.

## 2018-05-19 NOTE — Telephone Encounter (Signed)
Copied from CRM [id number]. Topic: Quick Communication - Rx Refill/Question >> May 19, 2018  2:37 PM Mickel Baas B, NT wrote: Medication: citalopram (CELEXA) 40 MG tablet  Has the patient contacted their pharmacy? yes (Agent: If no, request that the patient contact the pharmacy for the refill.) (Agent: If yes, when and what did the pharmacy advise?)  Preferred Pharmacy (with phone number or street name): [id number] - [place],  - [address]  Agent: Please be advised that RX refills may take up to 3 business days. We ask that you follow-up with your pharmacy.

## 2018-05-20 ENCOUNTER — Other Ambulatory Visit: Payer: Self-pay

## 2018-05-20 ENCOUNTER — Telehealth: Payer: Self-pay | Admitting: Family Medicine

## 2018-05-20 MED ORDER — CITALOPRAM HYDROBROMIDE 40 MG PO TABS: 40.0000 mg | ORAL_TABLET | Freq: Every day | ORAL | 1 refills | Status: DC

## 2018-05-20 NOTE — Telephone Encounter (Signed)
Pt called and would like a refill of the Citalopram - Pharmacy is [facility] on [address] in [place].

## 2018-05-20 NOTE — Telephone Encounter (Signed)
Citalopram 40mg  Last filled 08/20/17 #90/2 Last OV 08/20/17 Pt canceled ov for 05/22/18 Okay to refill?

## 2018-05-20 NOTE — Telephone Encounter (Signed)
Rx sent today.

## 2018-05-22 ENCOUNTER — Encounter: Payer: BLUE CROSS/BLUE SHIELD | Admitting: Family Medicine

## 2018-08-27 DIAGNOSIS — M129 Arthropathy, unspecified: Secondary | ICD-10-CM | POA: Diagnosis not present

## 2018-08-27 DIAGNOSIS — M25471 Effusion, right ankle: Secondary | ICD-10-CM | POA: Diagnosis not present

## 2018-08-27 DIAGNOSIS — Z1159 Encounter for screening for other viral diseases: Secondary | ICD-10-CM | POA: Diagnosis not present

## 2018-08-27 DIAGNOSIS — M109 Gout, unspecified: Secondary | ICD-10-CM | POA: Diagnosis not present

## 2018-09-01 ENCOUNTER — Ambulatory Visit (INDEPENDENT_AMBULATORY_CARE_PROVIDER_SITE_OTHER): Payer: BLUE CROSS/BLUE SHIELD | Admitting: Family Medicine

## 2018-09-01 ENCOUNTER — Encounter: Payer: Self-pay | Admitting: Family Medicine

## 2018-09-01 NOTE — Progress Notes (Signed)
No show

## 2018-09-01 NOTE — Patient Instructions (Signed)
There are no preventive care reminders to display for this patient.  Depression screen PHQ 2/9 08/20/2017  Decreased Interest 0  Down, Depressed, Hopeless 0  PHQ - 2 Score 0

## 2018-09-02 ENCOUNTER — Encounter: Payer: Self-pay | Admitting: Family Medicine

## 2018-09-08 ENCOUNTER — Telehealth: Payer: Self-pay | Admitting: Family Medicine

## 2018-09-08 NOTE — Telephone Encounter (Signed)
I called the patient to advise that we would waive the 7/20 no show due to no previous no shows. I advised if he gets a bill to call and ask for me and I will email charge corrections to adjust. No further action required. No need to route note.

## 2018-09-08 NOTE — Telephone Encounter (Signed)
Patient is calling because he was charged with a no show. On 09/01/2018 he states that he canceled it the Monday before. Thank you.  [phone]

## 2018-10-09 DIAGNOSIS — H6120 Impacted cerumen, unspecified ear: Secondary | ICD-10-CM | POA: Diagnosis not present

## 2018-10-09 DIAGNOSIS — H6123 Impacted cerumen, bilateral: Secondary | ICD-10-CM | POA: Diagnosis not present

## 2018-11-19 NOTE — Patient Instructions (Addendum)
Health Maintenance Due  Topic Date Due  . INFLUENZA VACCINE -11-17-18 (work) 09/13/2018   Depo medrol 80mg  injection today before you leave- this is a strong steroid that would calm down either gout or pseudogout  If not better by Monday - let me know so I can try to get you into sports medicine  Check uric acid level at physical when hopefully you are not in a flare up

## 2018-11-19 NOTE — Progress Notes (Signed)
Phone [phone]   Subjective:  Craig Benson is a 48 y.o. year old very pleasant male patient who presents for/with See problem oriented charting Chief Complaint  Patient presents with  . Follow-up  . left elbow pain   ROS-no fever/chills/nausea/vomiting.  Has redness along left MTP joint of great toe as well as some redness and tenderness primarily along medial ankle on the left  Past Medical History-  Patient Active Problem List   Diagnosis Date Noted  . Genital herpes 01/10/2010    Priority: Medium  . GAD (generalized anxiety disorder) also with history panic attacks around 2010 08/29/2007    Priority: Medium   Medications- reviewed and updated Current Outpatient Medications  Medication Sig Dispense Refill  . citalopram (CELEXA) 40 MG tablet Take 1 tablet (40 mg total) by mouth daily. 90 tablet 1  . predniSONE (DELTASONE) 20 MG tablet Take 2 pills for 3 days, 1 pill for 4 days 10 tablet 0   No current facility-administered medications for this visit.      Objective:  BP 134/80   Pulse 78   Temp 98.3 F (36.8 C)   Ht 5\' 8"  (1.727 m)   Wt 157 lb 9.6 oz (71.5 kg)   SpO2 99%   BMI 23.96 kg/m  Gen: NAD, resting comfortably  CV: RRR  Lungs: nonlabored, normal respiratory rate Abdomen: soft/nondistended  Ext: no edema on the right, pictured below you can see edema on left side from ankle down into the foot.  Patient has swelling, warmth, tenderness along medial malleolus and surrounding tissues.  Patient also has redness and warmth along left MTP joint great toe-tenderness worse at the ankle Skin: warm, dry MSK: Mobile fluctuant area on right elbow at olecranon       Assessment and Plan  # right elbow, left ankle pain, left great toe pain S:Patient states he started in July with stiffness in the left ankle- swollen, red. Apparently has been tested for gout in past and negative. Went to [facility] because swole up and then had bloodwork with them and some level  was elevated but still told not gout.  He denies ever having uric acid checked when he is not in a flareup.  They gave him a steroid shot in buttocks and prescription strength aleve and symptoms seemed to go away.   Sometime after that-he noted right enlargement over right elbow- red, puffy, swollen but never really hurt that bad.   Last week noted big toe flaring up again and woke up Sunday morning and ankle started feeling swollen/stiff- had a hard time walking on it. No fall or injury. Gets better during the day then each morning wakes up and really bothering him again.  Ankle is swollen, red, tender.  Pain is a constant sharp sensation- Aleve has not been particularly beneficial at this point.  Current symptoms in left great toe and left ankle going on for about a week  Denies any recent triggers for gout flares diet related  A/P: I am concerned that patient's left ankle and left great toe pain may be associated with gout.  Since he has had negative testing in the past-offered referral to sports medicine for more definitive testing as she declines for now.  We discussed possible gout versus pseudogout- he would like to retry Depo-Medrol injection and this was provided today at 80 mg. -We will get x-ray imaging of the left ankle given recurrent issues- to evaluate for any underlying arthritis  If symptoms cool off  then recur-send in prednisone for him to be able to use.  I would like to get an updated kidney function test before sending in colchicine-you prefer to do this at physical  When patient comes back we will get a uric acid not in the middle of the flare for more information and we will target uric acid under 6.  Still may need to consider sports medicine referral in the future depending on course  Patient also with olecranon bursitis-that appears to continue to heal-monitor only for now-could use compression in future if needed.  This certainly could be gout related as well.   #Generalized anxiety disorder S: Patient reports reasonable control of generalized anxiety recently.  He did not report any recent panic attacks.  Citalopram 40 mg seems to be a good regimen for him A/P: Good control-refilled citalopram 40 mg for patient today  Recommended follow up:  Future Appointments  Date Time Provider Department Center  01/02/2019  8:00 AM Shelva Majestic, MD [facility]   Lab/Order associations:   ICD-10-CM   1. Left ankle pain, unspecified chronicity  M25.572 DG Ankle Complete Left    methylPREDNISolone acetate (DEPO-MEDROL) injection 80 mg  2. Great toe pain, left  M79.675   3. Olecranon bursitis of right elbow  M70.21   4. GAD (generalized anxiety disorder) also with history panic attacks around 2010  F41.1     Meds ordered this encounter  Medications  . predniSONE (DELTASONE) 20 MG tablet    Sig: Take 2 pills for 3 days, 1 pill for 4 days    Dispense:  10 tablet    Refill:  0  . citalopram (CELEXA) 40 MG tablet    Sig: Take 1 tablet (40 mg total) by mouth daily.    Dispense:  90 tablet    Refill:  1  . methylPREDNISolone acetate (DEPO-MEDROL) injection 80 mg    Return precautions advised.  Tana Conch, MD

## 2018-11-20 ENCOUNTER — Encounter: Payer: Self-pay | Admitting: Family Medicine

## 2018-11-20 ENCOUNTER — Ambulatory Visit (INDEPENDENT_AMBULATORY_CARE_PROVIDER_SITE_OTHER): Payer: BLUE CROSS/BLUE SHIELD

## 2018-11-20 ENCOUNTER — Other Ambulatory Visit: Payer: Self-pay

## 2018-11-20 ENCOUNTER — Ambulatory Visit (INDEPENDENT_AMBULATORY_CARE_PROVIDER_SITE_OTHER): Payer: BLUE CROSS/BLUE SHIELD | Admitting: Family Medicine

## 2018-11-20 VITALS — BP 134/80 | HR 78 | Temp 98.3°F | Ht 68.0 in | Wt 157.6 lb

## 2018-11-20 DIAGNOSIS — M79675 Pain in left toe(s): Secondary | ICD-10-CM | POA: Diagnosis not present

## 2018-11-20 DIAGNOSIS — M25572 Pain in left ankle and joints of left foot: Secondary | ICD-10-CM

## 2018-11-20 DIAGNOSIS — M7021 Olecranon bursitis, right elbow: Secondary | ICD-10-CM

## 2018-11-20 DIAGNOSIS — F411 Generalized anxiety disorder: Secondary | ICD-10-CM

## 2018-11-20 DIAGNOSIS — M7989 Other specified soft tissue disorders: Secondary | ICD-10-CM | POA: Diagnosis not present

## 2018-11-20 MED ORDER — PREDNISONE 20 MG PO TABS: ORAL_TABLET | ORAL | 0 refills | Status: DC

## 2018-11-20 MED ORDER — METHYLPREDNISOLONE ACETATE 80 MG/ML IJ SUSP
80.0000 mg | Freq: Once | INTRAMUSCULAR | Status: AC
  Administered 2018-11-20: 80 mg via INTRAMUSCULAR

## 2018-11-20 MED ORDER — CITALOPRAM HYDROBROMIDE 40 MG PO TABS: 40.0000 mg | ORAL_TABLET | Freq: Every day | ORAL | 1 refills | Status: DC

## 2019-01-02 ENCOUNTER — Encounter: Payer: BLUE CROSS/BLUE SHIELD | Admitting: Family Medicine

## 2019-01-30 ENCOUNTER — Encounter: Payer: BLUE CROSS/BLUE SHIELD | Admitting: Family Medicine

## 2019-03-16 ENCOUNTER — Other Ambulatory Visit: Payer: Self-pay

## 2019-03-17 ENCOUNTER — Encounter: Payer: Self-pay | Admitting: Family Medicine

## 2019-03-17 ENCOUNTER — Ambulatory Visit (INDEPENDENT_AMBULATORY_CARE_PROVIDER_SITE_OTHER): Payer: BLUE CROSS/BLUE SHIELD | Admitting: Family Medicine

## 2019-03-17 VITALS — BP 120/82 | HR 79 | Temp 98.5°F | Ht 68.0 in | Wt 155.4 lb

## 2019-03-17 DIAGNOSIS — M25572 Pain in left ankle and joints of left foot: Secondary | ICD-10-CM

## 2019-03-17 DIAGNOSIS — Z13220 Encounter for screening for lipoid disorders: Secondary | ICD-10-CM | POA: Diagnosis not present

## 2019-03-17 DIAGNOSIS — F411 Generalized anxiety disorder: Secondary | ICD-10-CM | POA: Diagnosis not present

## 2019-03-17 DIAGNOSIS — Z0000 Encounter for general adult medical examination without abnormal findings: Secondary | ICD-10-CM | POA: Diagnosis not present

## 2019-03-17 DIAGNOSIS — Z1211 Encounter for screening for malignant neoplasm of colon: Secondary | ICD-10-CM

## 2019-03-17 DIAGNOSIS — Z1283 Encounter for screening for malignant neoplasm of skin: Secondary | ICD-10-CM

## 2019-03-17 LAB — CBC WITH DIFFERENTIAL/PLATELET
Basophils Absolute: 0 10*3/uL (ref 0.0–0.1)
Basophils Relative: 0.3 % (ref 0.0–3.0)
Eosinophils Absolute: 0.2 10*3/uL (ref 0.0–0.7)
Eosinophils Relative: 2.9 % (ref 0.0–5.0)
HCT: 42.5 % (ref 39.0–52.0)
Hemoglobin: 15 g/dL (ref 13.0–17.0)
Lymphocytes Relative: 28.3 % (ref 12.0–46.0)
Lymphs Abs: 1.9 10*3/uL (ref 0.7–4.0)
MCHC: 35.4 g/dL (ref 30.0–36.0)
MCV: 89.4 fl (ref 78.0–100.0)
Monocytes Absolute: 0.6 10*3/uL (ref 0.1–1.0)
Monocytes Relative: 8.9 % (ref 3.0–12.0)
Neutro Abs: 4 10*3/uL (ref 1.4–7.7)
Neutrophils Relative %: 59.6 % (ref 43.0–77.0)
Platelets: 202 10*3/uL (ref 150.0–400.0)
RBC: 4.76 Mil/uL (ref 4.22–5.81)
RDW: 12.4 % (ref 11.5–15.5)
WBC: 6.7 10*3/uL (ref 4.0–10.5)

## 2019-03-17 LAB — COMPREHENSIVE METABOLIC PANEL
ALT: 42 U/L (ref 0–53)
AST: 37 U/L (ref 0–37)
Albumin: 4.2 g/dL (ref 3.5–5.2)
Alkaline Phosphatase: 76 U/L (ref 39–117)
BUN: 13 mg/dL (ref 6–23)
CO2: 29 mEq/L (ref 19–32)
Calcium: 9.8 mg/dL (ref 8.4–10.5)
Chloride: 98 mEq/L (ref 96–112)
Creatinine, Ser: 0.99 mg/dL (ref 0.40–1.50)
GFR: 80.63 mL/min (ref >60.00)
Glucose, Bld: 96 mg/dL (ref 70–99)
Potassium: 4.2 mEq/L (ref 3.5–5.1)
Sodium: 138 mEq/L (ref 135–145)
Total Bilirubin: 1 mg/dL (ref 0.2–1.2)
Total Protein: 7 g/dL (ref 6.0–8.3)

## 2019-03-17 LAB — LIPID PANEL
Cholesterol: 239 mg/dL — ABNORMAL HIGH (ref 0–200)
HDL: 55.3 mg/dL (ref >39.00)
NonHDL: 183.77
Total CHOL/HDL Ratio: 4
Triglycerides: 267 mg/dL — ABNORMAL HIGH (ref 0.0–149.0)
VLDL: 53.4 mg/dL — ABNORMAL HIGH (ref 0.0–40.0)

## 2019-03-17 LAB — LDL CHOLESTEROL, DIRECT: Direct LDL: 150 mg/dL

## 2019-03-17 LAB — URIC ACID: Uric Acid, Serum: 9.6 mg/dL — ABNORMAL HIGH (ref 4.0–7.8)

## 2019-03-17 MED ORDER — CITALOPRAM HYDROBROMIDE 40 MG PO TABS: 40.0000 mg | ORAL_TABLET | Freq: Every day | ORAL | 3 refills | Status: DC

## 2019-03-17 NOTE — Patient Instructions (Addendum)
Health Maintenance Due  Topic Date Due  . INFLUENZA VACCINE -11/27/2018 09/13/2018   We will call you within two weeks about your referral to GI for colonoscopy and dermatology. If you do not hear within 3 weeks, give [place] a call.   Consider medicine for gout if uric acid above 6 called allopurinol   Please stop by lab before you go If you do not have mychart- we will call you about results within 5 business days of [place] receiving them.  If you have mychart- we will send your results within 3 business days of [place] receiving them.  If abnormal or we want to clarify a result, we will call or mychart you to make sure you receive the message.  If you have questions or concerns or don't hear within 5-7 days, please send [place] a message or call us.    Recommended follow up: Return in about 1 year (around 03/16/2020) for physical or sooner if needed.   Allopurinol tablets What is this medicine? ALLOPURINOL (al oh PURE i nole) reduces the amount of uric acid the body makes. It is used to treat the symptoms of gout. It is also used to treat or prevent high uric acid levels that occur as a result of certain types of chemotherapy. This medicine may also help patients who frequently have kidney stones. This medicine may be used for other purposes; ask your health care provider or pharmacist if you have questions. COMMON BRAND NAME(S): Zyloprim What should I tell my health care provider before I take this medicine? They need to know if you have any of these conditions:  kidney disease  liver disease  an unusual or allergic reaction to allopurinol, other medicines, foods, dyes, or preservatives  pregnant or trying to get pregnant  breast feeding How should I use this medicine? Take this medicine by mouth with a glass of water. Follow the directions on the prescription label. If this medicine upsets your stomach, take it with food or milk. Take your doses at regular intervals. Do not take your medicine more  often than directed. Talk to your pediatrician regarding the use of this medicine in children. Special care may be needed. While this drug may be prescribed for children as young as 6 years for selected conditions, precautions do apply. Overdosage: If you think you have taken too much of this medicine contact a poison control center or emergency room at once. NOTE: This medicine is only for you. Do not share this medicine with others. What if I miss a dose? If you miss a dose, take it as soon as you can. If it is almost time for your next dose, take only that dose. Do not take double or extra doses. What may interact with this medicine? Do not take this medicine with the following medication:  didanosine, ddI This medicine may also interact with the following medications:  certain antibiotics like amoxicillin, ampicillin  certain medicines for cancer  certain medicines for immunosuppression like azathioprine, cyclosporine, mercaptopurine  chlorpropamide  probenecid  thiazide diuretics, like hydrochlorothiazide  sulfinpyrazone  warfarin This list may not describe all possible interactions. Give your health care provider a list of all the medicines, herbs, non-prescription drugs, or dietary supplements you use. Also tell them if you smoke, drink alcohol, or use illegal drugs. Some items may interact with your medicine. What should I watch for while using this medicine? Visit your doctor or healthcare provider for regular checks on your progress. If you are taking  this medicine to treat gout, you may not have less frequent attacks at first. Keep taking your medicine regularly and the attacks should get better within 2 to 6 weeks. Drink plenty of water (10 to 12 full glasses a day) while you are taking this medicine. This will help to reduce stomach upset and reduce the risk of getting gout or kidney stones. Call your doctor or healthcare provider at once if you get a skin rash together  with chills, fever, sore throat, or nausea and vomiting, if you have blood in your urine, or difficulty passing urine. This medicine may cause serious skin reactions. They can happen weeks to months after starting the medicine. Contact your healthcare provider right away if you notice fevers or flu-like symptoms with a rash. The rash may be red or purple and then turn into blisters or peeling of the skin. Or, you might notice a red rash with swelling of the face, lips or lymph nodes in your neck or under your arms. Do not take vitamin C without asking your doctor or healthcare provider. Too much vitamin C can increase the chance of getting kidney stones. You may get drowsy or dizzy. Do not drive, use machinery, or do anything that needs mental alertness until you know how this drug affects you. Do not stand or sit up quickly, especially if you are an older patient. This reduces the risk of dizzy or fainting spells. Alcohol can make you more drowsy and dizzy. Alcohol can also increase the chance of stomach problems and increase the amount of uric acid in your blood. Avoid alcoholic drinks. What side effects may I notice from receiving this medicine? Side effects that you should report to your doctor or health care professional as soon as possible:  allergic reactions like skin rash, itching or hives, swelling of the face, lips, or tongue  breathing problems  joint pain  muscle pain  rash, fever, and swollen lymph nodes  redness, blistering, peeling, or loosening of the skin, including inside the mouth  signs and symptoms of infection like fever or chills; cough; sore throat  signs and symptoms of kidney injury like trouble passing urine or change in the amount of urine, flank pain  tingling, numbness in the hands or feet  unusual bleeding or bruising  unusually weak or tired Side effects that usually do not require medical attention (report to your doctor or health care professional if  they continue or are bothersome):  changes in taste  diarrhea  drowsiness  headache  nausea, vomiting  stomach upset This list may not describe all possible side effects. Call your doctor for medical advice about side effects. You may report side effects to FDA at 1-800-FDA-1088. Where should I keep my medicine? Keep out of the reach of children. Store at room temperature between 15 and 25 degrees C (59 and 77 degrees F). Protect from light and moisture. Throw away any unused medicine after the expiration date. NOTE: This sheet is a summary. It may not cover all possible information. If you have questions about this medicine, talk to your doctor, pharmacist, or health care provider.  2020 Elsevier/Gold Standard (2018-04-22 09:41:46)

## 2019-03-17 NOTE — Progress Notes (Signed)
Phone: [phone]    Subjective:  Patient presents today for their annual physical. Chief complaint-noted.   See problem oriented charting- ROS- full  review of systems was completed and negative  except for: joint pain, joint swelling  The following were reviewed and entered/updated in epic: Past Medical History:  Diagnosis Date  . GAD (generalized anxiety disorder)   . Genital herpes    Patient Active Problem List   Diagnosis Date Noted  . Genital herpes 01/10/2010    Priority: Medium  . GAD (generalized anxiety disorder) also with history panic attacks around 2010 08/29/2007    Priority: Medium   Past Surgical History:  Procedure Laterality Date  . left wrist surgery     ligmant surgery after obstacle course race  . TONSILLECTOMY     child    Family History  Problem Relation Age of Onset  . Leukemia Brother        childhood  . Testicular cancer Brother        chilldhood    Medications- reviewed and updated Current Outpatient Medications  Medication Sig Dispense Refill  . citalopram (CELEXA) 40 MG tablet Take 1 tablet (40 mg total) by mouth daily. 90 tablet 3   No current facility-administered medications for this visit.    Allergies-reviewed and updated Allergies  Allergen Reactions  . Amoxicillin     REACTION: rash  . Clindamycin Hcl     REACTION: rash    Social History   Social History Narrative   Married (our Investment banker, operational). 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 TM normal. Bridge of nose normal. Eyelids normal.  Neck: no thyromegaly or cervical lymphadenopathy  CV: RRR no murmurs rubs or gallops Lungs: CTAB no  crackles, wheeze, rhonchi Abdomen: soft/nontender/nondistended/normal bowel sounds. No rebound or guarding.  Ext: no edema Skin: warm, dry Neuro: grossly normal, moves all extremities, PERRLA Msk: bilateral olecranon bursa not tender but somewhat swollen    Assessment and Plan:  49 y.o. male presenting for annual physical.  Health Maintenance counseling: 1. Anticipatory guidance: Patient counseled regarding regular dental exams -q6 months, eye exams - no recent visits- no issues with vision other than up close- readers help- last exam around age 44 or 25 years ago- discussed possibly getting an updated exam around age 9,  avoiding smoking and second hand smoke , limiting alcohol to 2 beverages per day.   2. Risk factor reduction:  Advised patient of need for regular exercise and diet rich and fruits and vegetables to reduce risk of heart attack and stroke. Exercise-  3-4 days a week biking or walking or running or lifting. Diet-reasonably healthy.  Wt Readings from Last 3 Encounters:  03/17/19 155 lb 6.4 oz (70.5 kg)  11/20/18 157 lb 9.6 oz (71.5 kg)  08/20/17 160 lb (72.6 kg)  3. Immunizations/screenings/ancillary studies-flu shot.  Discussed COVID-19 vaccine when available- he is interested  Immunization History  Administered Date(s) Administered  . Influenza Whole 12/03/2007  . Influenza,inj,Quad PF,6+ Mos 11/27/2018  . Tdap 03/27/2016  4. Prostate  cancer screening- No family history, start at age 24   5. Colon cancer screening -  no family history, start at age 50. Has also noted some blood when wiping a few times over last year so good to go ahead and get colonscopy 6. Skin cancer screening/prevention-  Will refer to dermatology. advised regular sunscreen use. Denies worrisome, changing, or new skin lesions.  7. Testicular cancer screening- advised monthly self exams - no concerns 8. STD screening- patient opts out as monogamous 9.  Never smoker-   Status of chronic or acute  concerns   GAD (generalized anxiety disorder) also with history panic attacks around 2010-last vist was on Celexa 40 mg doing well- no SI.    Concern for possible gout during October visit-get uric acid level today and consider treatment if uric acid level above 6.  - has swelling in both elbows- reports healing now- had been red and swollen. Discussed olecranon bursitis and could be from gout. Also has had left knee pain rather mild - could still see sports medicine if needed  We will screen for hyperlipidemia  Recommended follow up: 1 year physical  Lab/Order associations: fasting   ICD-10-CM   1. Preventative health care  Z00.00 CBC with Differential/Platelet    Comprehensive metabolic panel    Lipid panel    Ambulatory referral to Gastroenterology  2. GAD (generalized anxiety disorder) also with history panic attacks around 2010  F41.1 CBC with Differential/Platelet    Comprehensive metabolic panel  3. Screening for hyperlipidemia  Z13.220 Lipid panel  4. Left ankle pain, unspecified chronicity  M25.572 Uric acid  5. Screen for colon cancer  Z12.11 Ambulatory referral to Gastroenterology  6. Screening exam for skin cancer  Z12.83 Ambulatory referral to Dermatology    Meds ordered this encounter  Medications  . citalopram (CELEXA) 40 MG tablet    Sig: Take 1 tablet (40 mg total) by mouth daily.    Dispense:  90 tablet    Refill:  3    Return precautions advised.   Garret Reddish, MD

## 2019-03-18 ENCOUNTER — Other Ambulatory Visit: Payer: Self-pay

## 2019-03-18 DIAGNOSIS — M109 Gout, unspecified: Secondary | ICD-10-CM

## 2019-03-18 MED ORDER — COLCHICINE 0.6 MG PO TABS: ORAL_TABLET | ORAL | 1 refills | Status: DC

## 2019-03-18 MED ORDER — ALLOPURINOL 100 MG PO TABS: 100.0000 mg | ORAL_TABLET | Freq: Every day | ORAL | 3 refills | Status: DC

## 2019-03-18 NOTE — Telephone Encounter (Signed)
Craig Benson, Thanks for the message. We are in the period of time between guideline change and universal commercial payer adoption of guidelines.  Average risk rating colonoscopy now recommended at age 49  We will investigate further and I expect this will be covered. We are working on it and will be back in touch with the patient to get his screening colonoscopy scheduled  Thanks for the referral Vonna Kotyk

## 2019-04-09 IMAGING — DX DG WRIST COMPLETE 3+V*L*
4 series · 4 of 4 positions shown · non-contrast
Comparison: None.

CLINICAL DATA: Injured wrist.  Prior history of wrist surgery.

EXAM:
LEFT WRIST - COMPLETE 3+ VIEW

[wrist pa]
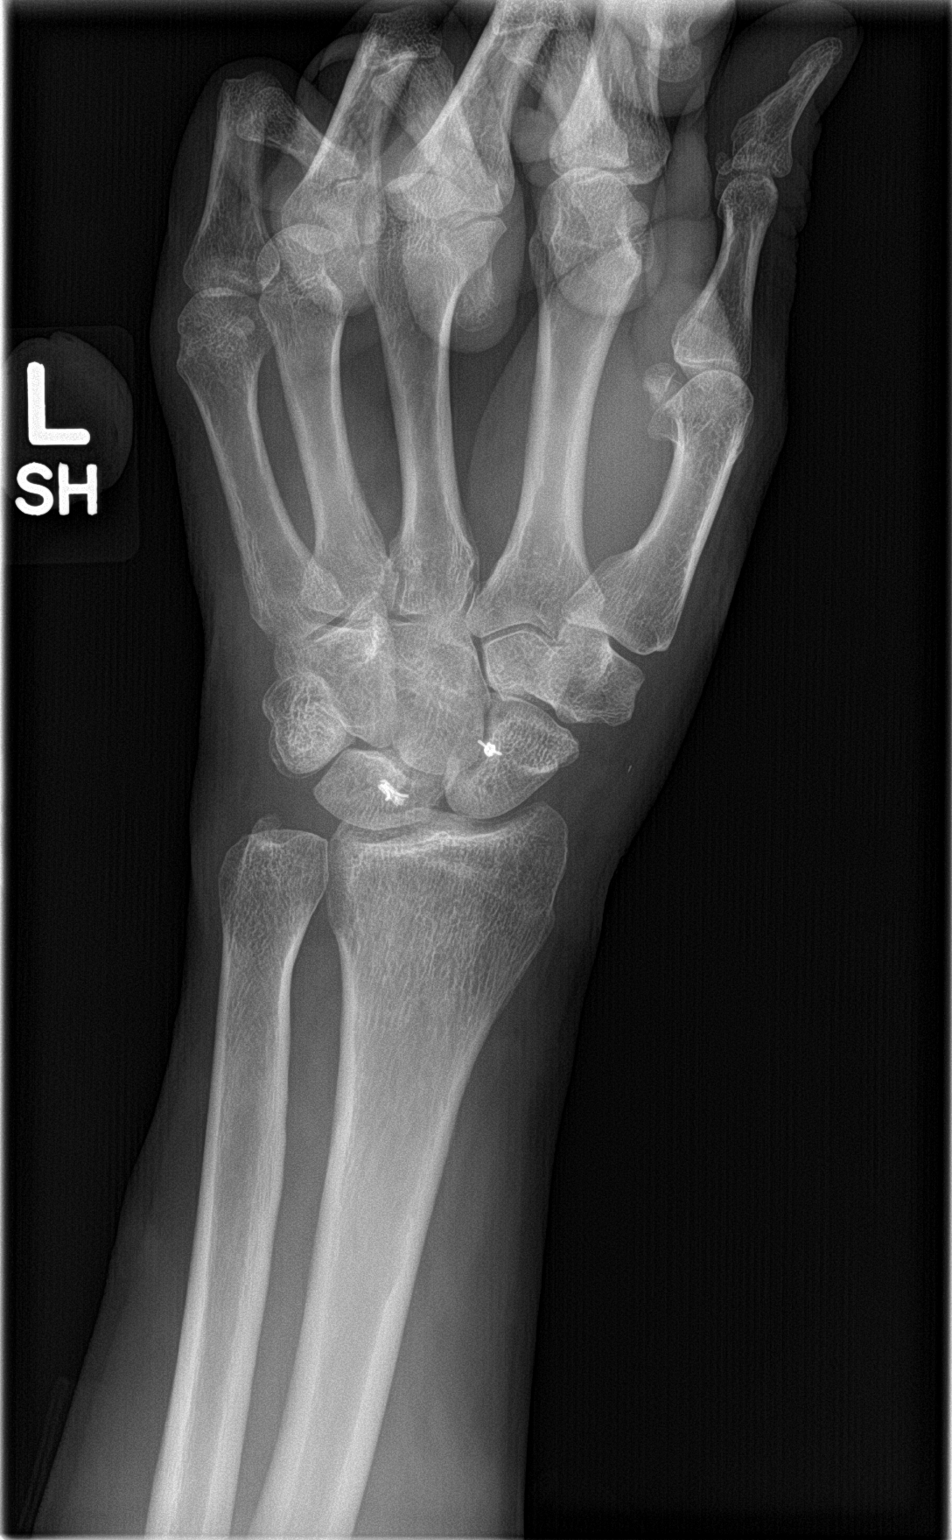

[wrist obl]
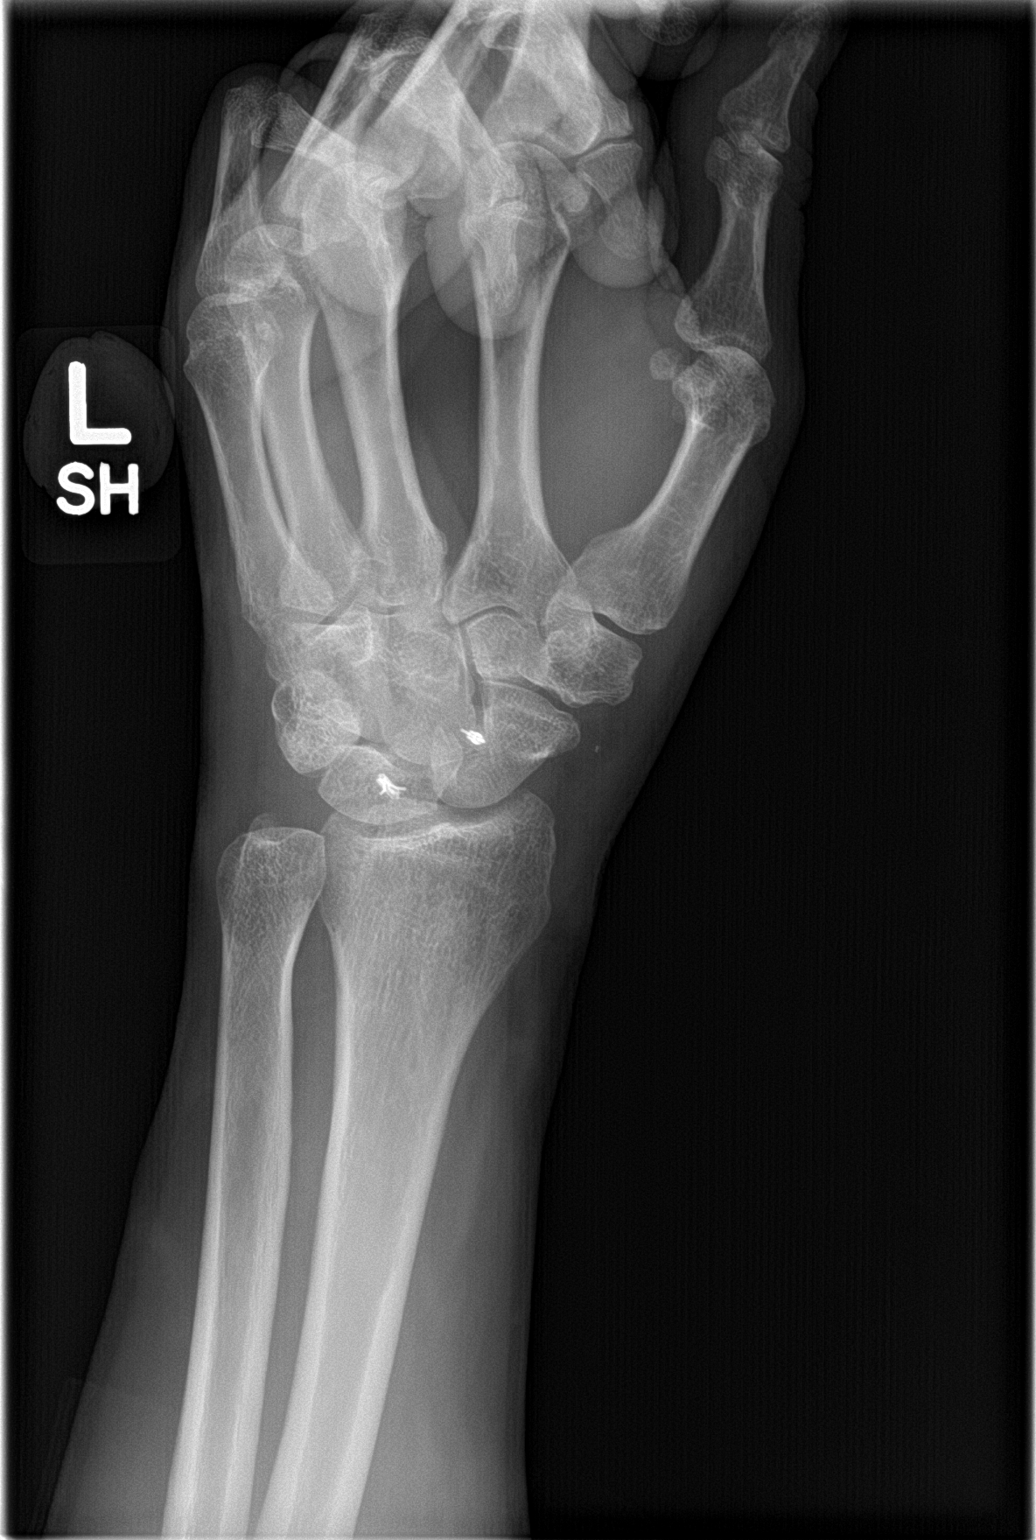

[wrist lat]
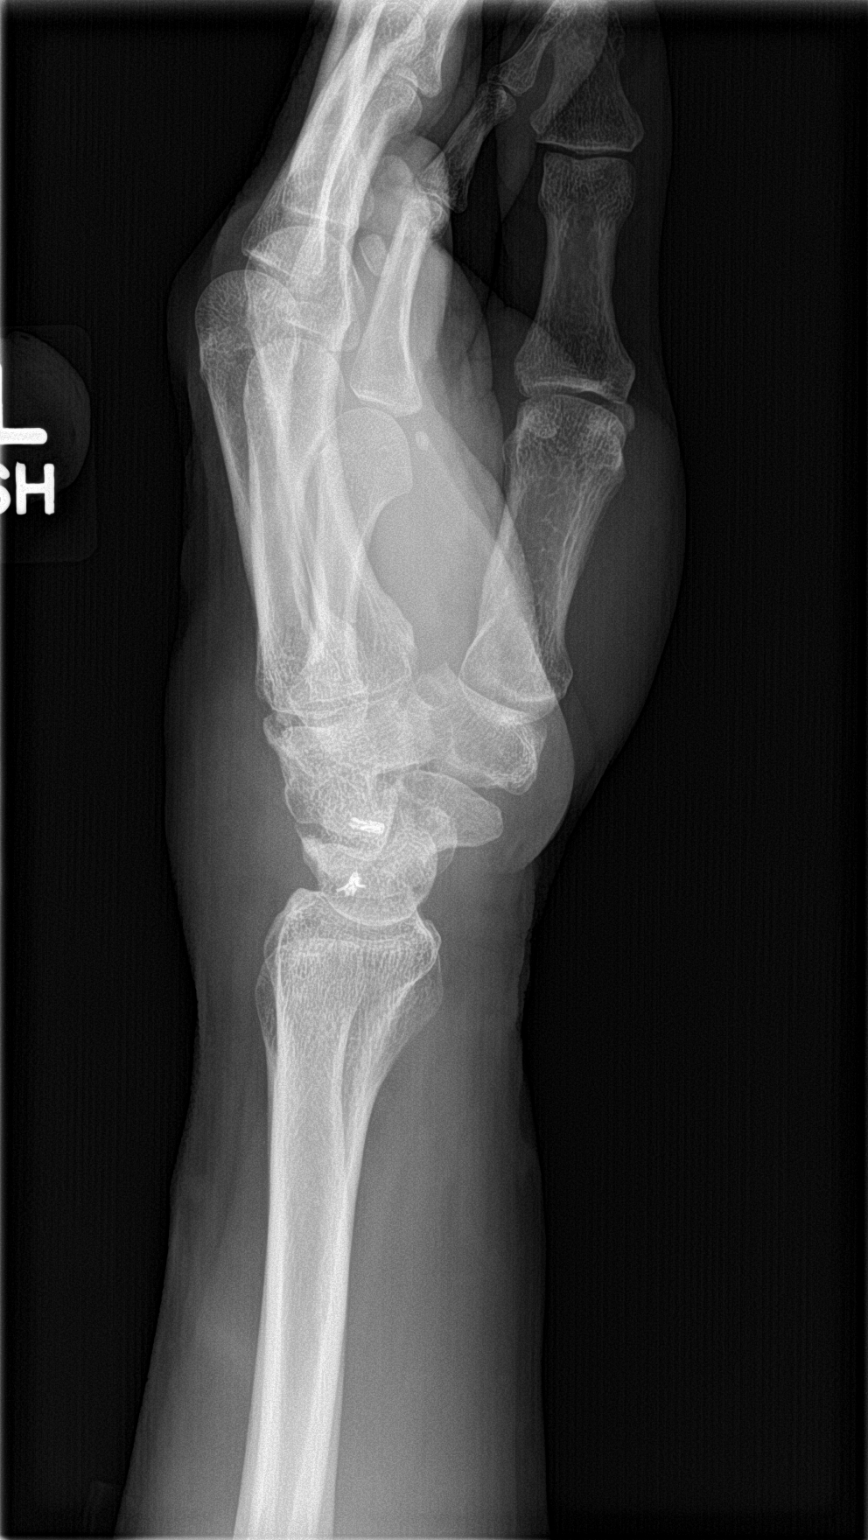

[wrist navicular]
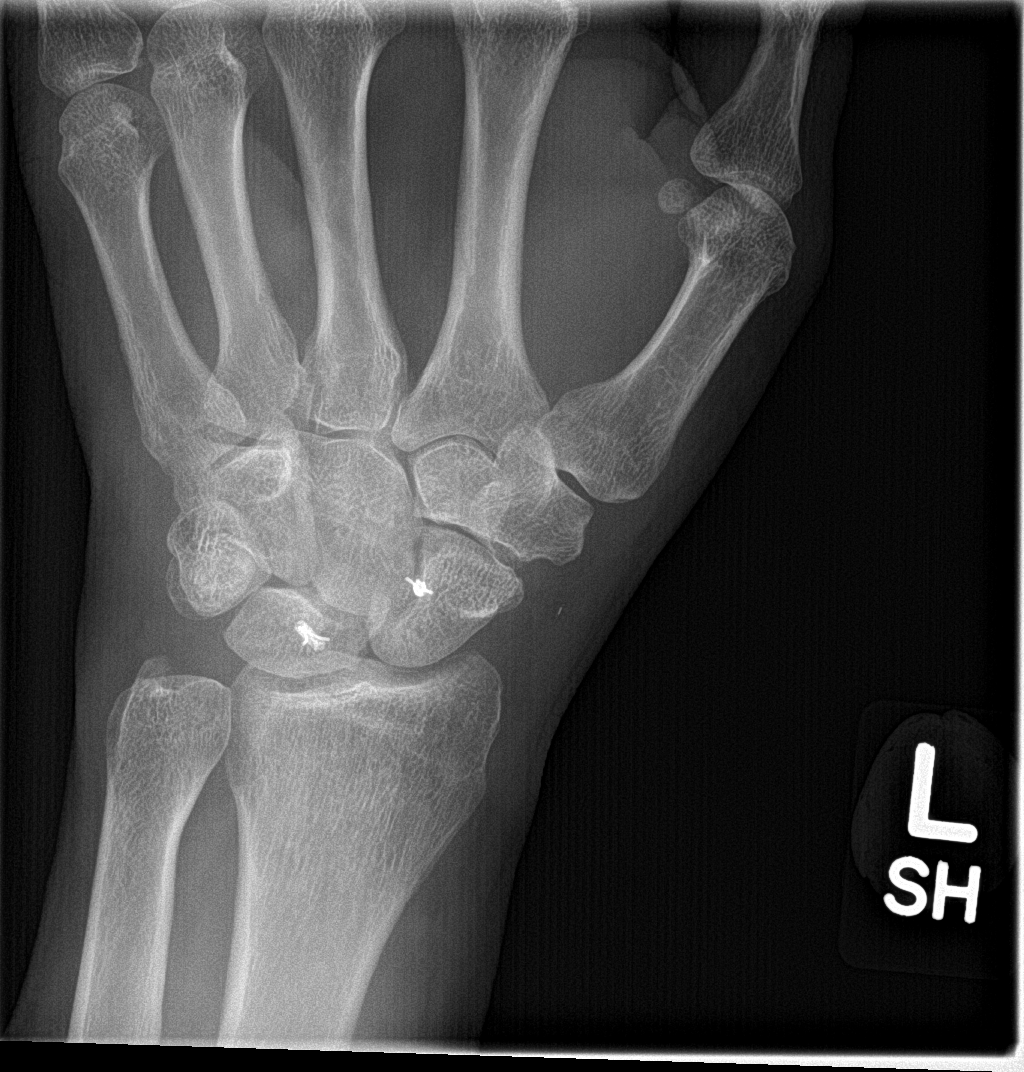

[4 of 4 positions shown; findings below may reference images not displayed]

FINDINGS: Small bone pledgets noted in the scaphoid and lunate bones likely
from a scapholunate ligament repair. The joint space appears mildly
widened. Possible early settling of the capitate between the
scaphoid and lunate bones. The radiocarpal joint space is
maintained. No acute bony findings.
IMPRESSION: No acute bony findings.

Evidence of prior surgery likely from scapholunate ligament repair.
The scapholunate joint space appears mildly widened and there
appears to be early settling of the capitate.

## 2019-04-17 ENCOUNTER — Ambulatory Visit: Payer: BLUE CROSS/BLUE SHIELD | Attending: Internal Medicine

## 2019-04-17 DIAGNOSIS — Z23 Encounter for immunization: Secondary | ICD-10-CM

## 2019-04-17 NOTE — Progress Notes (Signed)
   Covid-19 Vaccination Clinic  Name:  Craig Benson    MRN: [id number] DOB: [birth year]  04/17/2019  [name] was observed post Covid-19 immunization for 15 minutes without incident. He was provided with Vaccine Information Sheet and instruction to access the V-Safe system.   Mr. Tan was instructed to call 911 with any severe reactions post vaccine: Marland Kitchen Difficulty breathing  . Swelling of face and throat  . A fast heartbeat  . A bad rash all over body  . Dizziness and weakness

## 2019-04-30 ENCOUNTER — Encounter: Payer: Self-pay | Admitting: Family Medicine

## 2019-05-06 ENCOUNTER — Telehealth: Payer: Self-pay

## 2019-05-06 NOTE — Telephone Encounter (Signed)
Noted! Thank you

## 2019-05-06 NOTE — Telephone Encounter (Signed)
lvm regarding Flu vaccine

## 2019-05-06 NOTE — Telephone Encounter (Signed)
Patient received flu vaccine 11/13/2018 at [facility].

## 2019-05-13 ENCOUNTER — Ambulatory Visit: Payer: BLUE CROSS/BLUE SHIELD | Attending: Internal Medicine

## 2019-05-13 DIAGNOSIS — Z23 Encounter for immunization: Secondary | ICD-10-CM

## 2019-05-13 NOTE — Progress Notes (Signed)
   Covid-19 Vaccination Clinic  Name:  Craig Benson    MRN: [id number] DOB: [birth year]  05/13/2019  Mr. Craig Benson was observed post Covid-19 immunization for 15 minutes without incident. He was provided with Vaccine Information Sheet and instruction to access the V-Safe system.   Mr. Craig Benson was instructed to call 911 with any severe reactions post vaccine: Marland Kitchen Difficulty breathing  . Swelling of face and throat  . A fast heartbeat  . A bad rash all over body  . Dizziness and weakness   Immunizations Administered    Name Date Dose VIS Date Route   Pfizer COVID-19 Vaccine 05/13/2019  2:39 PM 0.3 mL 01/23/2019 Intramuscular   Manufacturer: [employer], [employer]   Lot: [id number]   NDC: [address]-4

## 2019-06-02 DIAGNOSIS — L814 Other melanin hyperpigmentation: Secondary | ICD-10-CM | POA: Diagnosis not present

## 2019-06-02 DIAGNOSIS — D224 Melanocytic nevi of scalp and neck: Secondary | ICD-10-CM | POA: Diagnosis not present

## 2019-06-02 DIAGNOSIS — D225 Melanocytic nevi of trunk: Secondary | ICD-10-CM | POA: Diagnosis not present

## 2019-06-02 DIAGNOSIS — X32XXXS Exposure to sunlight, sequela: Secondary | ICD-10-CM | POA: Diagnosis not present

## 2019-06-02 DIAGNOSIS — D2361 Other benign neoplasm of skin of right upper limb, including shoulder: Secondary | ICD-10-CM | POA: Diagnosis not present

## 2019-06-02 DIAGNOSIS — D485 Neoplasm of uncertain behavior of skin: Secondary | ICD-10-CM | POA: Diagnosis not present

## 2019-06-22 ENCOUNTER — Ambulatory Visit (INDEPENDENT_AMBULATORY_CARE_PROVIDER_SITE_OTHER): Payer: BLUE CROSS/BLUE SHIELD | Admitting: Family Medicine

## 2019-06-22 ENCOUNTER — Other Ambulatory Visit: Payer: Self-pay

## 2019-06-22 ENCOUNTER — Telehealth: Payer: Self-pay | Admitting: Family Medicine

## 2019-06-22 ENCOUNTER — Encounter: Payer: Self-pay | Admitting: Family Medicine

## 2019-06-22 VITALS — BP 130/82 | HR 87 | Temp 97.2°F | Resp 12 | Ht 68.0 in | Wt 158.1 lb

## 2019-06-22 DIAGNOSIS — M109 Gout, unspecified: Secondary | ICD-10-CM | POA: Diagnosis not present

## 2019-06-22 DIAGNOSIS — M25561 Pain in right knee: Secondary | ICD-10-CM | POA: Diagnosis not present

## 2019-06-22 DIAGNOSIS — M7041 Prepatellar bursitis, right knee: Secondary | ICD-10-CM | POA: Diagnosis not present

## 2019-06-22 MED ORDER — PREDNISONE 20 MG PO TABS: 40.0000 mg | ORAL_TABLET | Freq: Every day | ORAL | 0 refills | Status: AC

## 2019-06-22 MED ORDER — KETOROLAC TROMETHAMINE 60 MG/2ML IM SOLN
60.0000 mg | Freq: Once | INTRAMUSCULAR | Status: AC
  Administered 2019-06-22: 60 mg via INTRAMUSCULAR

## 2019-06-22 MED ORDER — METHYLPREDNISOLONE ACETATE 40 MG/ML IJ SUSP
40.0000 mg | Freq: Once | INTRAMUSCULAR | Status: AC
  Administered 2019-06-22: 40 mg via INTRAMUSCULAR

## 2019-06-22 MED ORDER — CELECOXIB 100 MG PO CAPS: 100.0000 mg | ORAL_CAPSULE | Freq: Two times a day (BID) | ORAL | 0 refills | Status: AC

## 2019-06-22 NOTE — Telephone Encounter (Signed)
Unable to get in contact with the patient. LVM asking to for a return call to schedule a appt with Dr. Durene Cal. Office number was provided.   If patient calls back please schedule him in a same day spot for 06-23-2019.

## 2019-06-22 NOTE — Telephone Encounter (Signed)
hief Complaint Knee Injury Reason for Call Symptomatic / Request for Health Information Initial Comment Caller states his knee is swollen and can not bend it. Caller states he can walk on it but not bend it. Translation No Nurse Assessment Nurse: Cain Saupe, RN, Dena Date/Time (Eastern Time): 06/22/2019 9:42:37 AM Confirm and document reason for call. If symptomatic, describe symptoms. ---Caller states right knee is painful swollen after riding a mountain bike; denies injury, has hx of gout in foot. Pt takes allopurinol daily. Reports knee is red, warm to touch on front and sides. Unable to bend completely; is able to put weight on leg. Has the patient had close contact with a person known or suspected to have the novel coronavirus illness OR traveled / lives in area with major community spread (including international travel) in the last 14 days from the onset of symptoms? * If Asymptomatic, screen for exposure and travel within the last 14 days. ---No Does the patient have any new or worsening symptoms? ---Yes Will a triage be completed? ---Yes Related visit to physician within the last 2 weeks? ---No Does the PT have any chronic conditions? (i.e. diabetes, asthma, this includes High risk factors for pregnancy, etc.) ---Yes List chronic conditions. ---gout; takes allopurinol 100mg  one daily Is this a behavioral health or substance abuse call? ---No Guidelines Guideline Title Affirmed Question Affirmed Notes Nurse Date/Time Time) Knee Pain [1] SEVERE pain (e.g., excruciating, unable to walk) AND [2] not improved after 2 hours of pain medicine Lamount Cohen, RN, Dena 06/22/2019 9:46:08 AMPLEASE NOTE: All timestamps contained within this report are represented as 08/22/2019 Standard Time. CONFIDENTIALTY NOTICE: This fax transmission is intended only for the addressee. It contains information that is legally privileged, confidential or otherwise protected from use or disclosure. If you are  not the intended recipient, you are strictly prohibited from reviewing, disclosing, copying using or disseminating any of this information or taking any action in reliance on or regarding this information. If you have received this fax in error, please notify [place] immediately by telephone so that we can arrange for its return to [place]. Phone: [phone], Toll-Free: [phone], Fax: [phone] Page: 2 of 2 Call Id: [phone] Disp. Time [id number] Time) Disposition Final User 06/22/2019 9:52:51 AM Called On-Call Provider 08/22/2019, RN, Dena Reason: Placed call to back line per guidelines: spoke with Kiara and connected pt for appt. 06/22/2019 9:50:29 AM See HCP within 4 Hours (or PCP triage) Yes 08/22/2019, RN, Dena Caller Disagree/Comply Comply Caller Understands Yes PreDisposition Call Doctor Care Advice Given Per Guideline SEE HCP WITHIN 4 HOURS (OR PCP TRIAGE): * IF OFFICE WILL BE CLOSED AND NO PCP (PRIMARY CARE PROVIDER) SECOND-LEVEL TRIAGE: You need to be seen within the next 3 or 4 hours. A nearby Urgent Care Center [facility]) is often a good source of care. Another choice is to go to the ED. Go sooner if you become worse. CALL BACK IF: * You become worse. CARE ADVICE given per Knee Pain (Adult) guideline. Comments User: [facility], RN Date/Time Virginia Crews Time): 06/22/2019 9:47:17 AM Knee pain rated 10/10 User: 08/22/2019, RN Date/Time (Eastern Time): 06/22/2019 9:48:44 AM Temp 96.4 via forehead scanner. Denies fever/chills. Unsure if thermometer is working properly Referrals REFERRED TO PCP OFFICE Warm transfer to backline

## 2019-06-22 NOTE — Progress Notes (Signed)
ACUTE VISIT  Chief Complaint  Patient presents with  . Joint Swelling    right knee swollen since Saturday, redness and extreme pain when bending   HPI: CraigFuquan Benson is a 49 y.o. male, who is here today with above concern. Right knee pain,erythema,and edema that started 2 days ago. Severe pain,suddent onset,constant, max 10/10.  Alleviated by standing up, 2/10. Exacerbated by palpation and minimal joint flexion.  Hx of gout. He is on Allopurinol 100 mg daily. He has not had gout attacks sine medication was started. Usually gout affects right elbow and great toe right foot.  No changes in diet. Denies high alcohol intake.  Lab Results  Component Value Date   LABURIC 9.6 (H) 03/17/2019   Lab Results  Component Value Date   CREATININE 0.99 03/17/2019   BUN 13 03/17/2019   NA 138 03/17/2019   K 4.2 03/17/2019   CL 98 03/17/2019   CO2 29 03/17/2019   No hx of direct trauma but earlier same day of presentation he hit proximal pretibial area, mild. Negative for fever, chills, body aches, fatigue, CP,SOB,palpitations,abdominal pain, N/V, dysuria, or gross hematuria.  He has taken Aleve, none today.  Review of Systems  Constitutional: Positive for activity change. Negative for appetite change.  Respiratory: Negative for cough and wheezing.   Gastrointestinal: Negative for abdominal pain, nausea and vomiting.  Musculoskeletal: Positive for gait problem. Negative for back pain.  Neurological: Negative for syncope, weakness and headaches.  Psychiatric/Behavioral: Positive for sleep disturbance. Negative for confusion.  Rest see pertinent positives and negatives per HPI.  Current Outpatient Medications on File Prior to Visit  Medication Sig Dispense Refill  . allopurinol (ZYLOPRIM) 100 MG tablet Take 1 tablet (100 mg total) by mouth daily. 90 tablet 3  . citalopram (CELEXA) 40 MG tablet Take 1 tablet (40 mg total) by mouth daily. 90 tablet 3   No current  facility-administered medications on file prior to visit.   Past Medical History:  Diagnosis Date  . GAD (generalized anxiety disorder)   . Genital herpes    Allergies  Allergen Reactions  . Amoxicillin     REACTION: rash  . Clindamycin Hcl     REACTION: rash    Social History   Socioeconomic History  . Marital status: Married    Spouse name: Not on file  . Number of children: Not on file  . Years of education: Not on file  . Highest education level: Not on file  Occupational History  . Not on file  Tobacco Use  . Smoking status: Never Smoker  . Smokeless tobacco: Never Used  Substance and Sexual Activity  . Alcohol use: Yes    Alcohol/week: 4.0 standard drinks    Types: 4 Standard drinks or equivalent per week  . Drug use: No  . Sexual activity: Not on file  Other Topics Concern  . Not on file  Social History Narrative   Married (our Sport and exercise psychologist). 2 sons.11 and 9 in 03/2019      Works for [employer] at [employer]: obstacle races, runs, enjoys exercise/sports   Social Determinants of Radio broadcast assistant Strain:   . Difficulty of Paying Living Expenses:   Food Insecurity:   . Worried About Charity fundraiser in the Last Year:   . Arboriculturist in the Last Year:   Transportation Needs:   . Film/video editor (Medical):   Marland Kitchen Lack  of Transportation (Non-Medical):   Physical Activity:   . Days of Exercise per Week:   . Minutes of Exercise per Session:   Stress:   . Feeling of Stress :   Social Connections:   . Frequency of Communication with Friends and Family:   . Frequency of Social Gatherings with Friends and Family:   . Attends Religious Services:   . Active Member of Clubs or Organizations:   . Attends Banker Meetings:   Marland Kitchen Marital Status:    Vitals:   06/22/19 1231  BP: 130/82  Pulse: 87  Resp: 12  Temp: (!) 97.2 F (36.2 C)  SpO2: 96%   Body mass index is 24.04  kg/m.  Physical Exam  Nursing note and vitals reviewed. Constitutional: He is oriented to person, place, and time. He appears well-developed and well-nourished. He does not appear ill. No distress (as far as he is standing up,which helps with pain).  HENT:  Head: Normocephalic and atraumatic.  Eyes: Conjunctivae are normal.  Cardiovascular: Normal rate and regular rhythm.  Pulses:      Dorsalis pedis pulses are 2+ on the right side.       Posterior tibial pulses are 2+ on the right side.  Respiratory: Effort normal. No respiratory distress.  GI: Soft. He exhibits no mass. There is no abdominal tenderness.  Musculoskeletal:        General: No edema.     Right knee: Swelling, effusion (minimal) and erythema present. Decreased range of motion. Tenderness present over the patellar tendon. No medial joint line or lateral joint line tenderness.       Legs:     Comments: Tenderness upon palpation or prepatellar area and patella tendon. Local heat and anterior knee erythema. Marked limitation of flexion. Antalgic gait.  Neurological: He is alert and oriented to person, place, and time. He has normal strength. Gait abnormal.  Skin: Skin is warm. No rash noted. No erythema.  Psychiatric: He has a normal mood and affect.  Well groomed, good eye contact.   ASSESSMENT AND PLAN:  Craig Benson was seen today for joint swelling.  Diagnoses and all orders for this visit:  Acute pain of right knee We discussed possible etiologies. I do not think this is related with a bacterial infectious process. Celebrex and milligrams twice daily x5 days recommended. LE elevation. Monitor for new symptoms.  -     celecoxib (CELEBREX) 100 MG capsule; Take 1 capsule (100 mg total) by mouth 2 (two) times daily for 5 days. -     predniSONE (DELTASONE) 20 MG tablet; Take 2 tablets (40 mg total) by mouth daily with breakfast for 5 days. -     ketorolac (TORADOL) injection 60 mg -     methylPREDNISolone acetate  (DEPO-MEDROL) injection 40 mg  Prepatellar bursitis of right knee Here in the office and after verbal consent he received Toradol 60 mg IM and Depo-Medrol 40 mg IM. 15 minutes after observation, he is doing well. Celebrex 100 mg twice daily, recommend starting in 8 to 10 hours. Tomorrow he can start with prednisone 40 mg with breakfast x5 days. He does not think he needs a note for work. Clearly instructed about warning signs.  -     celecoxib (CELEBREX) 100 MG capsule; Take 1 capsule (100 mg total) by mouth 2 (two) times daily for 5 days. -     ketorolac (TORADOL) injection 60 mg -     methylPREDNISolone acetate (DEPO-MEDROL) injection 40 mg  Gout, unspecified cause, unspecified chronicity, unspecified site Continue allopurinol 100 mg twice daily. Low purine diet recommended.  -     predniSONE (DELTASONE) 20 MG tablet; Take 2 tablets (40 mg total) by mouth daily with breakfast for 5 days. -     ketorolac (TORADOL) injection 60 mg -     methylPREDNISolone acetate (DEPO-MEDROL) injection 40 mg   Return if symptoms worsen or fail to improve.   Graciemae Delisle G. [place], MD  [facility]. [place] office.  Discharge Instructions   None    A few things to remember from today's visit:  In 8-10 hours you can start taking Celebrex 100 mg 2 times daily.  You can also take over-the-counter acetaminophen/Tylenol 500 mg 3-4 times per day. Tomorrow you will start prednisone, take medication with breakfast and do not take it at the same time that the Celebrex. Extremity elevation. Continue allopurinol 100 mg daily.  If not greatly improved in 48 to 72 hours, please follow-up with your PCP, you may need blood work and imaging.  Monitor for fever or worsening symptoms, in which case you need to seek immediate medical attention.   Please be sure medication list is accurate. If a new problem present, please set up appointment sooner than planned today.

## 2019-06-22 NOTE — Patient Instructions (Addendum)
A few things to remember from today's visit:  In 8-10 hours you can start taking Celebrex 100 mg 2 times daily.  You can also take over-the-counter acetaminophen/Tylenol 500 mg 3-4 times per day. Tomorrow you will start prednisone, take medication with breakfast and do not take it at the same time that the Celebrex. Extremity elevation. Continue allopurinol 100 mg daily.  If not greatly improved in 48 to 72 hours, please follow-up with your PCP, you may need blood work and imaging.  Monitor for fever or worsening symptoms, in which case you need to seek immediate medical attention.   Please be sure medication list is accurate. If a new problem present, please set up appointment sooner than planned today.

## 2019-10-22 DIAGNOSIS — Z20822 Contact with and (suspected) exposure to covid-19: Secondary | ICD-10-CM | POA: Diagnosis not present

## 2020-03-07 ENCOUNTER — Telehealth: Payer: Self-pay

## 2020-03-07 MED ORDER — ALLOPURINOL 100 MG PO TABS: 100.0000 mg | ORAL_TABLET | Freq: Every day | ORAL | 3 refills | Status: DC

## 2020-03-07 MED ORDER — CITALOPRAM HYDROBROMIDE 40 MG PO TABS: 40.0000 mg | ORAL_TABLET | Freq: Every day | ORAL | 3 refills | Status: DC

## 2020-03-07 NOTE — Telephone Encounter (Signed)
..   LAST APPOINTMENT DATE: 04/05/19   NEXT APPOINTMENT DATE:@Visit  date not found  MEDICATION:allopurinol (ZYLOPRIM) 100 MG tablet  citalopram (CELEXA) 40 MG tablet    PHARMACY: [id number] - [place], [place] - [address] [place] **Let patient know to contact pharmacy at the end of the day to make sure medication is ready. **  ** Please notify patient to allow 48-72 hours to process**  **Encourage patient to contact the pharmacy for refills or they can request refills through [facility]**  CLINICAL FILLS OUT ALL BELOW:   LAST REFILL:  QTY:  REFILL DATE:    OTHER COMMENTS:    Okay for refill?  Please advise

## 2020-03-07 NOTE — Telephone Encounter (Signed)
Medications sent to pharmacy

## 2020-12-15 IMAGING — DX DG ANKLE COMPLETE 3+V*L*
3 series · 3 of 3 positions shown · non-contrast
Comparison: None.

CLINICAL DATA: Left ankle pain

EXAM:
LEFT ANKLE COMPLETE - 3+ VIEW

[ankle ap]
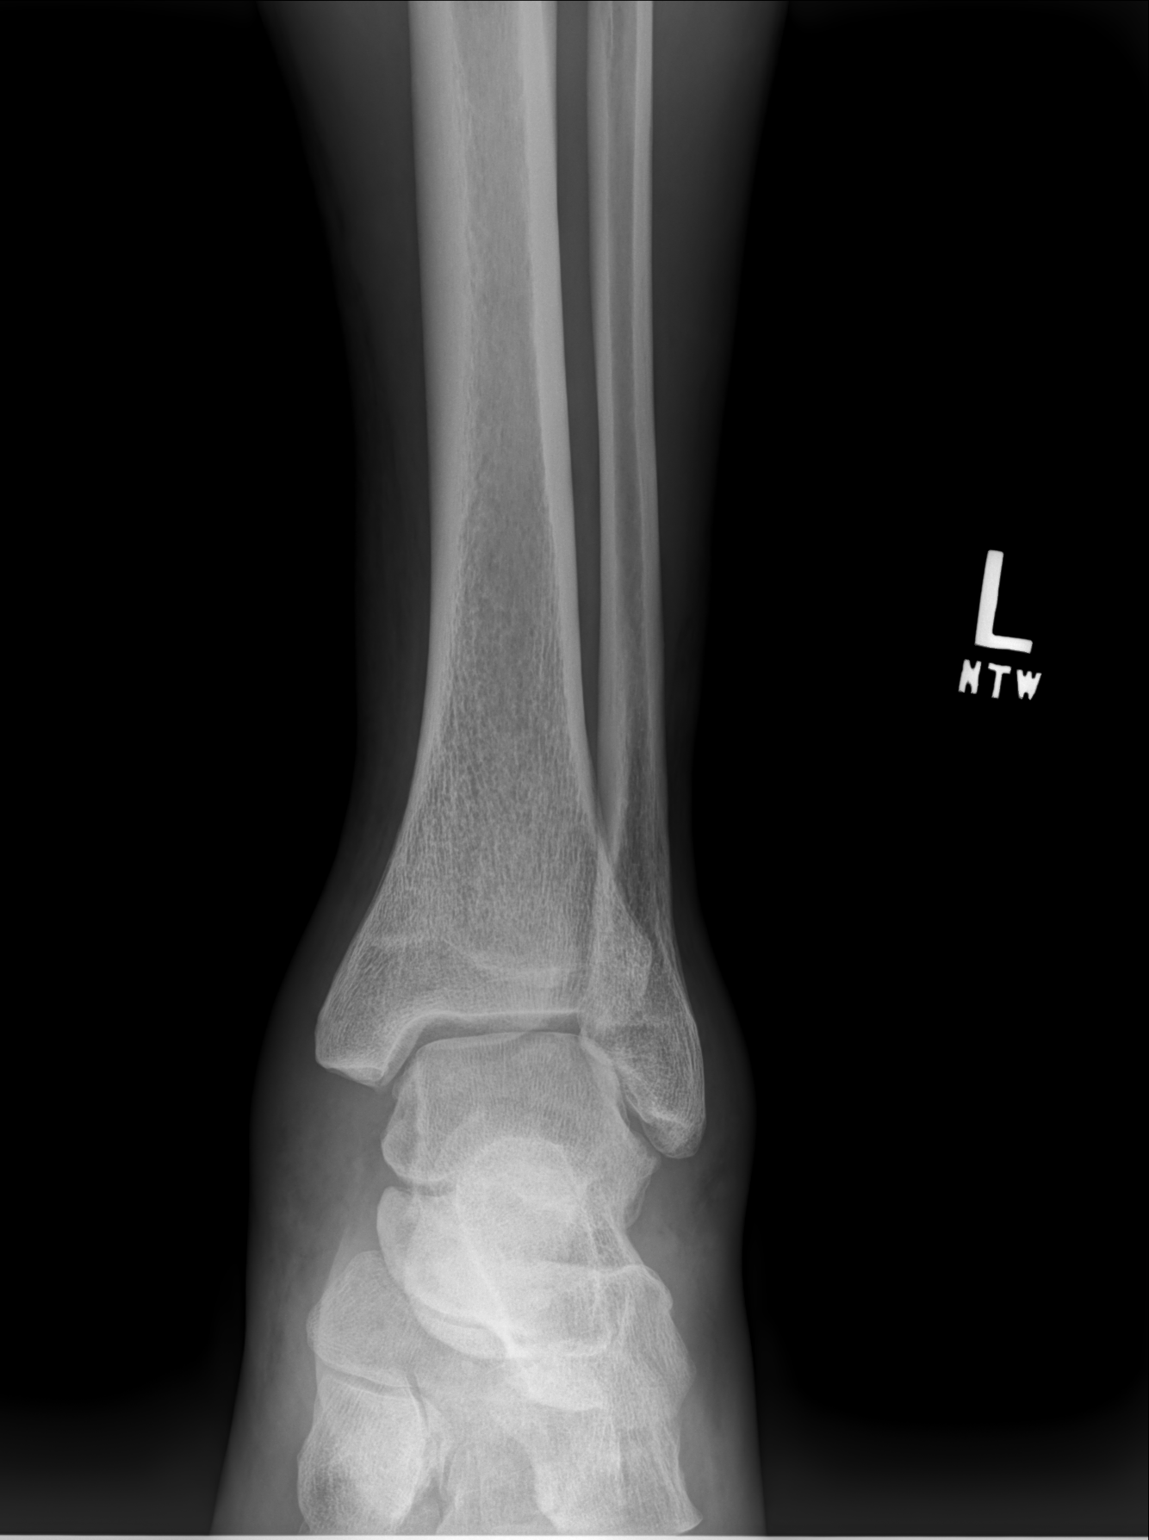

[ankle oblique]
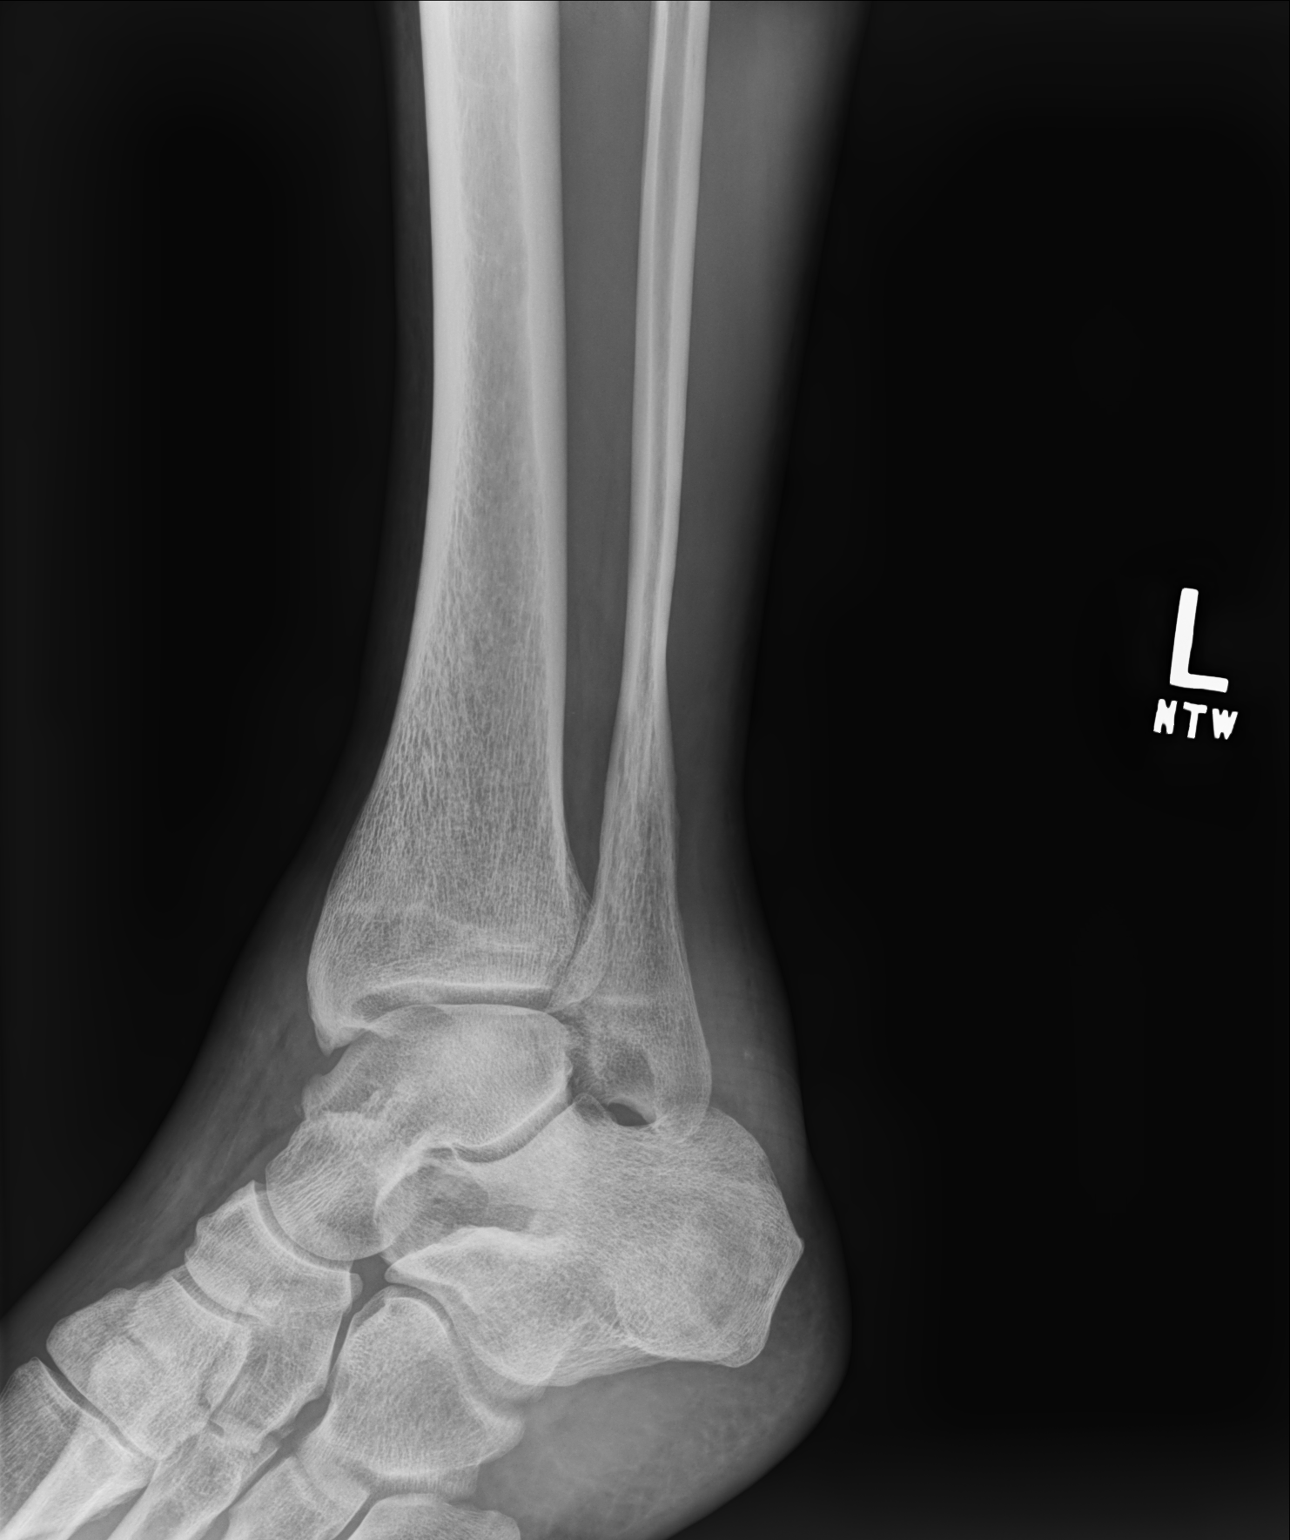

[ankle lat]
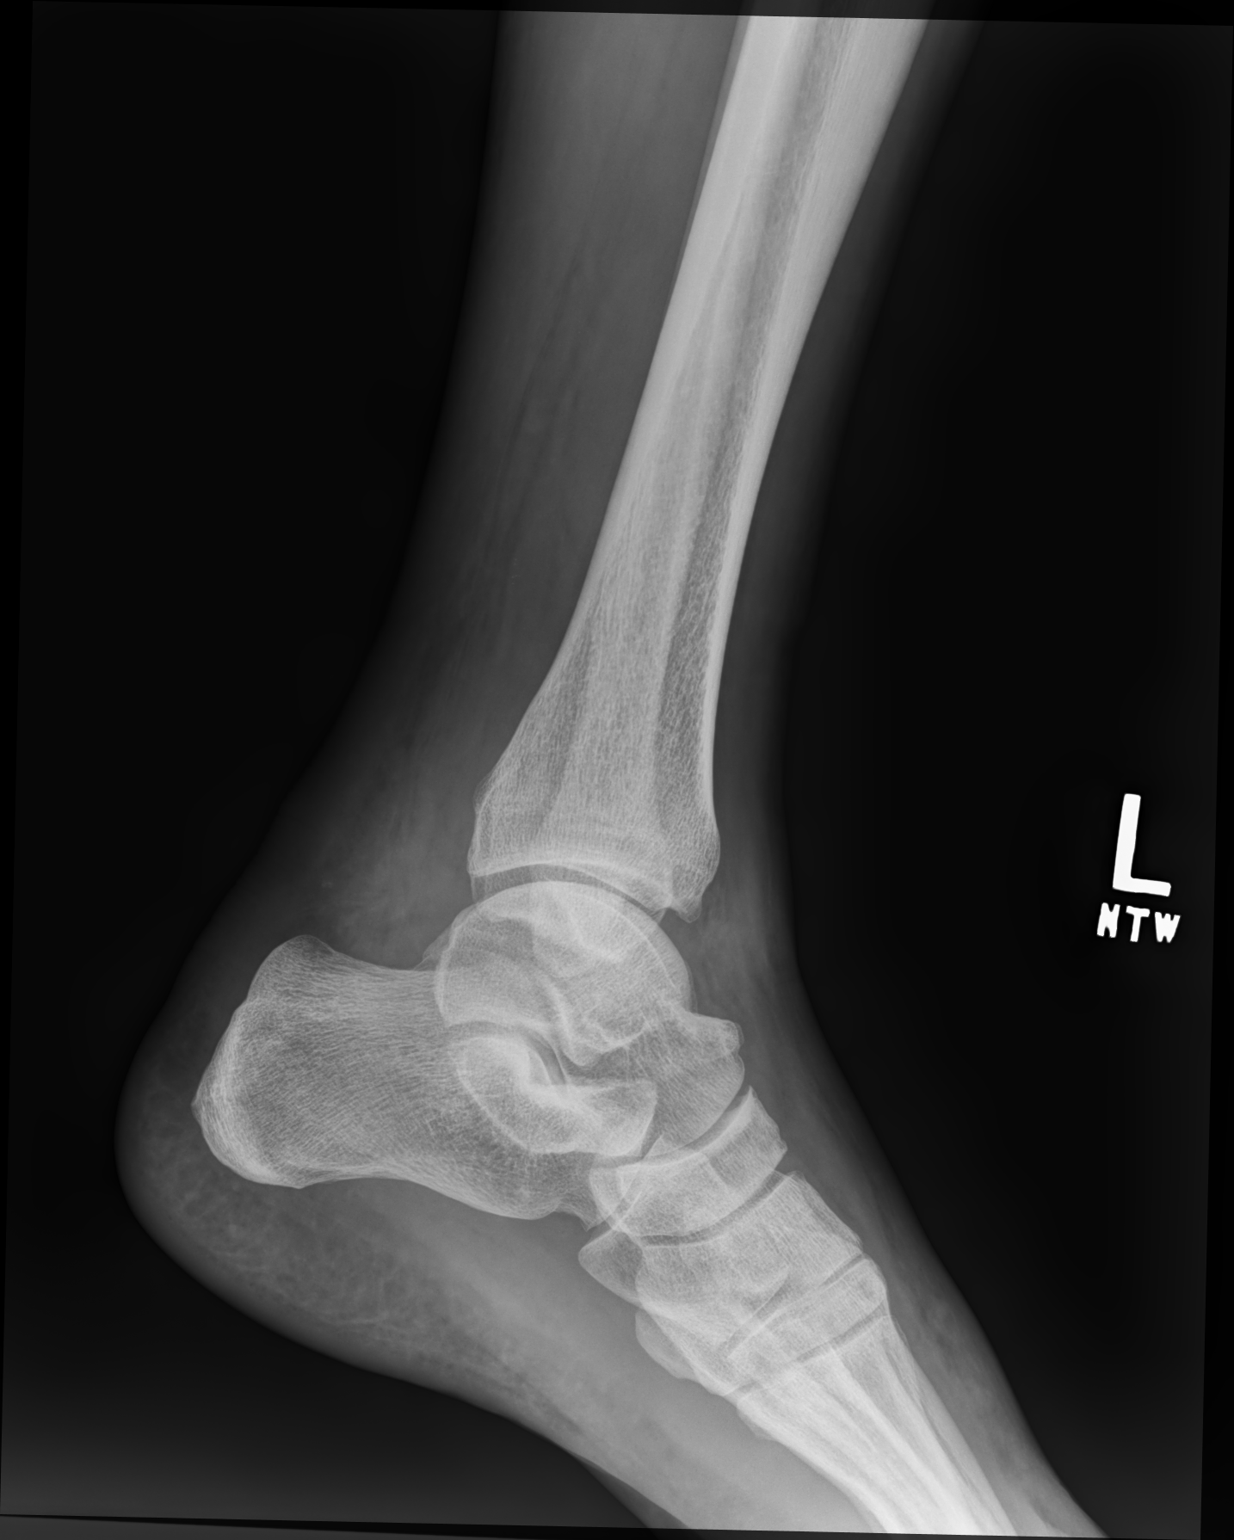

[3 of 3 positions shown; findings below may reference images not displayed]

FINDINGS: Generalized soft tissue swelling is noted. No acute fracture or
dislocation is seen. No soft tissue abnormality is noted.
IMPRESSION: Soft tissue swelling without acute bony abnormality.

## 2021-02-24 ENCOUNTER — Telehealth: Payer: Self-pay | Admitting: Family Medicine

## 2021-02-24 MED ORDER — ALLOPURINOL 100 MG PO TABS: 100.0000 mg | ORAL_TABLET | Freq: Every day | ORAL | 3 refills | Status: DC

## 2021-02-24 MED ORDER — CITALOPRAM HYDROBROMIDE 40 MG PO TABS: 40.0000 mg | ORAL_TABLET | Freq: Every day | ORAL | 3 refills | Status: DC

## 2021-02-24 NOTE — Telephone Encounter (Signed)
Patient wants refills of Allopurinol and Citalopram. Thank You!

## 2021-02-24 NOTE — Telephone Encounter (Signed)
Medications refilled

## 2021-02-28 ENCOUNTER — Other Ambulatory Visit: Payer: Self-pay

## 2021-02-28 ENCOUNTER — Ambulatory Visit: Payer: 59 | Admitting: Family Medicine

## 2021-02-28 ENCOUNTER — Encounter: Payer: Self-pay | Admitting: Family Medicine

## 2021-02-28 VITALS — BP 130/80 | HR 87 | Temp 98.3°F | Ht 68.0 in | Wt 157.2 lb

## 2021-02-28 DIAGNOSIS — E785 Hyperlipidemia, unspecified: Secondary | ICD-10-CM | POA: Insufficient documentation

## 2021-02-28 DIAGNOSIS — Z1211 Encounter for screening for malignant neoplasm of colon: Secondary | ICD-10-CM

## 2021-02-28 DIAGNOSIS — M1A0790 Idiopathic chronic gout, unspecified ankle and foot, without tophus (tophi): Secondary | ICD-10-CM | POA: Diagnosis not present

## 2021-02-28 DIAGNOSIS — F411 Generalized anxiety disorder: Secondary | ICD-10-CM | POA: Diagnosis not present

## 2021-02-28 DIAGNOSIS — Z1159 Encounter for screening for other viral diseases: Secondary | ICD-10-CM

## 2021-02-28 MED ORDER — CITALOPRAM HYDROBROMIDE 40 MG PO TABS: 40.0000 mg | ORAL_TABLET | Freq: Every day | ORAL | 3 refills | Status: DC

## 2021-02-28 MED ORDER — ALLOPURINOL 100 MG PO TABS: 100.0000 mg | ORAL_TABLET | Freq: Every day | ORAL | 3 refills | Status: DC

## 2021-02-28 NOTE — Progress Notes (Signed)
Phone [phone] In person visit   Subjective:   Craig Benson is a 51 y.o. year old very pleasant male patient who presents for/with See problem oriented charting Chief Complaint  Patient presents with   GAD    Pt is needing Celexa refilled.   Gout    Pt is needing allopurinol refilled.    This visit occurred during the SARS-CoV-2 public health emergency.  Safety protocols were in place, including screening questions prior to the visit, additional usage of staff PPE, and extensive cleaning of exam room while observing appropriate contact time as indicated for disinfecting solutions.   Past Medical History-  Patient Active Problem List   Diagnosis Date Noted   Hyperlipidemia 02/28/2021    Priority: Medium    Chronic idiopathic gout involving toe without tophus 02/28/2021    Priority: Medium    Genital herpes 01/10/2010    Priority: Medium    GAD (generalized anxiety disorder) also with history panic attacks around 2010 08/29/2007    Priority: Medium     Medications- reviewed and updated Current Outpatient Medications  Medication Sig Dispense Refill   allopurinol (ZYLOPRIM) 100 MG tablet Take 1 tablet (100 mg total) by mouth daily. 90 tablet 3   citalopram (CELEXA) 40 MG tablet Take 1 tablet (40 mg total) by mouth daily. 90 tablet 3   No current facility-administered medications for this visit.     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 SW He has some friends that do F3 from work but he has a good workout regimen (Exercise- [employer]. A few days of cardio few days of  weights, some mountain biking.)  Already in place as well as good community/support  # GAD with history of panic attacks around 2010 S:Medication: compliant with citalopram 40 mg daily Counseling: none at moment A/P: GAD-7 and PHQ-9 scores both 0-excellent control of anxiety-continue current medication-1 year of refills provided today.  No recent panic attacks reported-we discussed potentially reducing the dose but since he has been so stable prefers to maintain current regimen  #Gout S: 0 flares since starting med after last physical on allopurinol 100 mg daily Lab Results  Component Value Date   LABURIC 9.6 (H) 03/17/2019  A/P:well controlled- update uric acid with next labs- continue current meds  #hyperlipidemia S: Medication:none  Lab Results  Component Value Date   CHOL 239 (H) 03/17/2019   HDL 55.30 03/17/2019   LDLDIRECT 150.0 03/17/2019   TRIG 267.0 (H) 03/17/2019   CHOLHDL 4 03/17/2019   A/P: 10-year ASCVD risk at 9%-above 7.5% sometimes will consider getting more information with CT cardiac scan-we are going to wait and get updated physical labs before making this determination-he is scheduled in June for physical and we are ordering labs today  Recommended follow up: Keep doing physical Future Appointments  Date Time Provider Department Center  07/20/2021  8:30 AM [facility]  07/25/2021  3:40 PM 07/27/2021, Durene Cal, MD [facility]  Lab/Order associations:   ICD-10-CM   1. GAD (generalized anxiety disorder) also with history panic attacks around 2010  F41.1     2. Chronic idiopathic gout involving toe without tophus, unspecified laterality  M1A.0790     3. Hyperlipidemia, unspecified hyperlipidemia type  E78.5 Comprehensive metabolic panel    CBC with Differential/Platelet    Lipid panel    4. Encounter for hepatitis C screening test for low risk patient  Z11.59 Hepatitis C antibody    5. Screen for colon cancer  Z12.11 Ambulatory referral to  Gastroenterology      Meds ordered this encounter  Medications   allopurinol (ZYLOPRIM) 100 MG tablet    Sig: Take 1 tablet (100 mg total) by mouth daily.    Dispense:  90 tablet    Refill:  3   citalopram (CELEXA) 40 MG tablet    Sig: Take 1 tablet (40 mg total) by mouth daily.    Dispense:  90 tablet    Refill:  3   I,Harris Phan,acting as a scribe for Tana Conch, MD.,have documented all relevant documentation on the behalf of Tana Conch, MD,as directed by  Tana Conch, MD while in the presence of Tana Conch, MD.  I, Tana Conch, MD, have reviewed all documentation for this visit. The documentation on 02/28/21 for the exam, diagnosis, procedures, and orders are all accurate and complete.   Return precautions advised.  Tana Conch, MD

## 2021-02-28 NOTE — Patient Instructions (Addendum)
Health Maintenance Due  Topic Date Due   Hepatitis C Screening - with physical labs Never done   COLONOSCOPY  Check with insurance first  Then call  to set up [facility] contact Address: [address], [place], [place] [address] Phone: [phone]  Never done   COVID-19 Vaccine -will mychart booster date 07/08/2019   Zoster Vaccines- Shingrix (1 of 2)- wants to hold for now Never done   Schedule a lab visit at the check out desk at least 2 days before your next visit/physical.  Return for future fasting labs meaning nothing but water after midnight please. Ok to take your medications with water.   NO labs today  Recommended follow up: keep physical in june

## 2021-07-20 ENCOUNTER — Other Ambulatory Visit: Payer: 59

## 2021-07-25 ENCOUNTER — Encounter: Payer: BLUE CROSS/BLUE SHIELD | Admitting: Family Medicine

## 2021-08-29 ENCOUNTER — Other Ambulatory Visit: Payer: Self-pay | Admitting: Family Medicine

## 2021-11-06 ENCOUNTER — Encounter: Payer: Self-pay | Admitting: *Deleted

## 2022-01-25 ENCOUNTER — Encounter: Payer: Self-pay | Admitting: *Deleted

## 2022-03-06 ENCOUNTER — Telehealth: Payer: Self-pay | Admitting: Family Medicine

## 2022-03-06 NOTE — Telephone Encounter (Signed)
Vml for patient to call back and sch CPE per Dr Yong Channel

## 2022-03-06 NOTE — Telephone Encounter (Signed)
-----  Message from Marin Olp, MD sent at 03/05/2022  8:42 PM EST ----- Regarding: FW: Cancellation of Order # [id number] Team can you check in with him? Saw him last year but he never did the labs. Also been about a year since we have seen him so would be good to have follow up or he could schedule physical in a few months at least  ----- Message ----- From: Armando Gang Job Sent: 03/05/2022   5:36 AM EST To: Marin Olp, MD Subject: Cancellation of Order # [id number]              Order number [id number] for the procedure COMPREHENSIVE METABOLIC  PANEL [[id number]] has been canceled by Batch [employer] Rte [[email or web address]]. This procedure was ordered by Marin Olp, MD [[id number]] on  Feb 28, 2021 for the patient Craig Benson [[id number]]. The reason  for cancellation was "Order Expired".  This was a future order.

## 2022-03-07 ENCOUNTER — Other Ambulatory Visit: Payer: Self-pay | Admitting: Family Medicine

## 2022-03-09 ENCOUNTER — Telehealth: Payer: Self-pay | Admitting: Family Medicine

## 2022-03-09 MED ORDER — CITALOPRAM HYDROBROMIDE 40 MG PO TABS: 40.0000 mg | ORAL_TABLET | Freq: Every day | ORAL | 0 refills | Status: DC

## 2022-03-09 MED ORDER — ALLOPURINOL 100 MG PO TABS: 100.0000 mg | ORAL_TABLET | Freq: Every day | ORAL | 0 refills | Status: DC

## 2022-03-09 NOTE — Telephone Encounter (Signed)
30 day supply sent to pharmacy.  

## 2022-03-09 NOTE — Telephone Encounter (Signed)
Yes thanks-only 30-day supply with no refills of current meds that I have prescribed

## 2022-03-09 NOTE — Telephone Encounter (Signed)
Ok to refill enough until appt?

## 2022-03-09 NOTE — Telephone Encounter (Signed)
Pt states he is out of meds and has not been seen since 2021, he has a physical appt in February and would like to know if he can get enough until his appt. Please advise.

## 2022-04-03 ENCOUNTER — Encounter: Payer: 59 | Admitting: Family Medicine

## 2022-04-04 ENCOUNTER — Other Ambulatory Visit: Payer: Self-pay | Admitting: Family Medicine

## 2022-07-23 ENCOUNTER — Encounter: Payer: Self-pay | Admitting: Family Medicine

## 2022-07-23 ENCOUNTER — Ambulatory Visit (INDEPENDENT_AMBULATORY_CARE_PROVIDER_SITE_OTHER): Payer: 59 | Admitting: Family Medicine

## 2022-07-23 VITALS — BP 120/80 | HR 90 | Temp 97.6°F | Ht 68.0 in | Wt 157.6 lb

## 2022-07-23 DIAGNOSIS — F411 Generalized anxiety disorder: Secondary | ICD-10-CM | POA: Diagnosis not present

## 2022-07-23 DIAGNOSIS — E785 Hyperlipidemia, unspecified: Secondary | ICD-10-CM | POA: Diagnosis not present

## 2022-07-23 DIAGNOSIS — Z0000 Encounter for general adult medical examination without abnormal findings: Secondary | ICD-10-CM | POA: Diagnosis not present

## 2022-07-23 DIAGNOSIS — M1A0790 Idiopathic chronic gout, unspecified ankle and foot, without tophus (tophi): Secondary | ICD-10-CM | POA: Diagnosis not present

## 2022-07-23 MED ORDER — ALLOPURINOL 100 MG PO TABS: 100.0000 mg | ORAL_TABLET | Freq: Every day | ORAL | 3 refills | Status: DC

## 2022-07-23 MED ORDER — CITALOPRAM HYDROBROMIDE 40 MG PO TABS: 40.0000 mg | ORAL_TABLET | Freq: Every day | ORAL | 3 refills | Status: DC

## 2022-07-23 NOTE — Patient Instructions (Signed)
Call and schedule colonoscopy with group your wife used and have them send [place] a copy when completed  Schedule a lab visit at the check out desk within 2 weeks. Return for future fasting labs meaning nothing but water after midnight please. Ok to take your medications with water.   Recommended follow up: Return in about 1 year (around 07/23/2023) for physical or sooner if needed.Schedule b4 you leave.

## 2022-07-23 NOTE — Progress Notes (Signed)
Phone: [phone]   Subjective:  Patient presents today for their annual physical. Chief complaint-noted.   See problem oriented charting- ROS- full  review of systems was completed and negative  Per full ROS sheet completed by patient- other than some back issues related to L4-L5- so far shot not helpful- has follow up on Friday with orthopedic in [place]  The following were reviewed and entered/updated in epic: Past Medical History:  Diagnosis Date   GAD (generalized anxiety disorder)    Genital herpes    Patient Active Problem List   Diagnosis Date Noted   Hyperlipidemia 02/28/2021    Priority: Medium    Chronic idiopathic gout involving toe without tophus 02/28/2021    Priority: Medium    Genital herpes 01/10/2010    Priority: Medium    GAD (generalized anxiety disorder) also with history panic attacks around 2010 08/29/2007    Priority: Medium    Past Surgical History:  Procedure Laterality Date   left wrist surgery     ligmant surgery after obstacle course race   TONSILLECTOMY     child    Family History  Problem Relation Age of Onset   Healthy Mother    Alzheimer's disease Father        26 when passed. lived with this for 5 years at least.   Leukemia Brother        childhood   Testicular cancer Brother        childhood   Alzheimer's disease Maternal Grandmother    Alzheimer's disease Maternal Grandfather    Heart attack Maternal Grandfather        early 26s, not sure if smoker   Alzheimer's disease Paternal Grandmother    Heart attack Paternal Grandfather        early 42s, not sure if smoker    Medications- reviewed and updated Current Outpatient Medications  Medication Sig Dispense Refill   allopurinol (ZYLOPRIM) 100 MG tablet TAKE 1 TABLET BY MOUTH EVERY DAY 90 tablet 1   citalopram (CELEXA) 40 MG tablet Take 1 tablet (40 mg total) by mouth daily. 30 tablet 0   No current facility-administered medications for this visit.   Allergies-reviewed  and updated Allergies  Allergen Reactions   Amoxicillin     REACTION: rash   Clindamycin Hcl     REACTION: rash   Social History   Social History Narrative   Married (our Investment banker, operational). 2 sons.13 and 11 in 02/2021.       Works for [employer] at [employer]: obstacle races, runs, enjoys exercise/sports   Objective  Objective:  BP 120/80   Pulse 90   Temp 97.6 F (36.4 C)   Ht 5\' 8"  (1.727 m)   Wt 157 lb 9.6 oz (71.5 kg)   SpO2 96%   BMI 23.96 kg/m  Gen: NAD, resting comfortably HEENT: Mucous membranes are moist. Oropharynx normal. Tympanic membrane normal beyond some noted cerumen- curetted out probably 50% of visibile wax Neck: no thyromegaly CV: RRR no murmurs rubs or gallops Lungs: CTAB no crackles, wheeze, rhonchi Abdomen: soft/nontender/nondistended/normal bowel sounds. No rebound or guarding.  Ext: no edema Skin: warm, dry Neuro: grossly normal, moves all extremities, PERRLA   Assessment and Plan  52 y.o. male presenting for annual physical.  Health Maintenance counseling: 1. Anticipatory guidance: Patient counseled regarding regular dental exams -q4 months, eye exams - sparing- only if issues- readers only- check in at least every 5 years,  avoiding  smoking and second hand smoke , limiting alcohol to 2 beverages per day - under 5 a week, no illicit drugs .   2. Risk factor reduction:  Advised patient of need for regular exercise and diet rich and fruits and vegetables to reduce risk of heart attack and stroke.  Exercise- still going to gym most days and working around the back issues- can do weights and [employer]- treadmill pounding is bothersome.  Diet/weight management-weight roughly stable within last 5 years within about 5 lbs variance. Very healthy weight- reasonably healthy diet Wt Readings from Last 3 Encounters:  07/23/22 157 lb 9.6 oz (71.5 kg)  02/28/21 157 lb 3.2 oz (71.3 kg)  06/22/19 158 lb 2 oz (71.7 kg)  3.  Immunizations/screenings/ancillary studies- opts out of COVID vaccine and shingrix , Hepatitis C Virus (HCV) screening opts out Immunization History  Administered Date(s) Administered   Influenza Whole 12/03/2007   Influenza,inj,Quad PF,6+ Mos 11/27/2018, 11/26/2020   PFIZER(Purple Top)SARS-COV-2 Vaccination 04/17/2019, 05/13/2019   Tdap 03/27/2016  4. Prostate cancer screening-  no family history, start at age 36   5. Colon cancer screening - referred in past for colonoscopy but has not yet completed - [facility]- Call and schedule colonoscopy with group your wife used and have them send [place] a copy when completed 6. Skin cancer screening- referred to derm in 2021- had one mole removed but benign. Only as needed follow up. advised regular sunscreen use. Denies worrisome, changing, or new skin lesions.  7. Smoking associated screening (lung cancer screening, AAA screen 65-75, UA)- never smoker 8. STD screening - only active with wife  Status of chronic or acute concerns   #social update- sons continue to do well. Upcoming [place] trip together.   #Working on back issues with [facility] in [place]- has had injection recently - 2 weeks ago from tomorrow  # Anxiety/ panic attack history around 2010  S:Medication: celexa 40 mg - no SI Counseling: none - no panic attacks and anxiety controlled A/P: doing well- continue current medications    #Gout S: Medication: allopurinol 100 mg- no flares since starting Lab Results  Component Value Date   LABURIC 9.6 (H) 03/17/2019  A/P:suspect uric acid improved- update today- I would be hesitant to increase the dose without flares even if above ideal goal of 6    #hyperlipidemia S: Medication:none  Lab Results  Component Value Date   CHOL 239 (H) 03/17/2019   HDL 55.30 03/17/2019   LDLDIRECT 150.0 03/17/2019   TRIG 267.0 (H) 03/17/2019   CHOLHDL 4 03/17/2019   A/P: hopefully improved- mild elevatoins  in LDL and triglyceride(s) - he will come back for fasting labs  Recommended follow up: Return in about 1 year (around 07/23/2023) for physical or sooner if needed.Schedule b4 you leave.  Lab/Order associations:NOT fasting   ICD-10-CM   1. Routine general medical examination at a health care facility  Z00.00     2. Hyperlipidemia, unspecified hyperlipidemia type  E78.5 Comprehensive metabolic panel    CBC with Differential/Platelet    Lipid panel    3. Chronic idiopathic gout involving toe without tophus, unspecified laterality  M1A.0790 Uric acid    4. GAD (generalized anxiety disorder) also with history panic attacks around 2010  F41.1       No orders of the defined types were placed in this encounter.   Return precautions advised.  Tana Conch, MD

## 2022-08-09 ENCOUNTER — Other Ambulatory Visit: Payer: 59

## 2023-01-25 ENCOUNTER — Encounter: Payer: Self-pay | Admitting: Family Medicine

## 2023-01-25 NOTE — Telephone Encounter (Signed)
Called pt to offer an OV sometime next week with PCP but pt declined stating he isn't available anytime next week. Patient then informed me that he is set to have back surgery on 12/23.

## 2023-01-25 NOTE — Telephone Encounter (Signed)
See below

## 2023-01-28 NOTE — Telephone Encounter (Signed)
Please schedule lab visit also as pt did not complete labs that were scheduled in June.

## 2023-01-28 NOTE — Telephone Encounter (Signed)
Labs from June are now considered to be too old to be collected since 6 months has passed since ordered. Can these be re-ordered? Also not likely that pt can come in to have drawn since he mentioned he wouldn't be available this week.

## 2023-01-28 NOTE — Telephone Encounter (Signed)
Called pt to get scheduled for lab visit. Patient states not available these coming weeks, but will schedule lab visit near first of the year.

## 2023-01-28 NOTE — Telephone Encounter (Signed)
FYI

## 2023-01-28 NOTE — Telephone Encounter (Signed)
Future labs are good for a year. Ok to schedule labs for when he is available.

## 2023-02-16 ENCOUNTER — Emergency Department (HOSPITAL_BASED_OUTPATIENT_CLINIC_OR_DEPARTMENT_OTHER)
Admission: EM | Admit: 2023-02-16 | Discharge: 2023-02-16 | Disposition: A | Discharge status: Home / Self Care | Payer: 59 | Attending: Emergency Medicine | Admitting: Emergency Medicine

## 2023-02-16 ENCOUNTER — Encounter (HOSPITAL_BASED_OUTPATIENT_CLINIC_OR_DEPARTMENT_OTHER): Payer: Self-pay | Admitting: Emergency Medicine

## 2023-02-16 DIAGNOSIS — R202 Paresthesia of skin: Secondary | ICD-10-CM | POA: Diagnosis not present

## 2023-02-16 DIAGNOSIS — M545 Low back pain, unspecified: Secondary | ICD-10-CM | POA: Diagnosis present

## 2023-02-16 DIAGNOSIS — D72829 Elevated white blood cell count, unspecified: Secondary | ICD-10-CM | POA: Diagnosis not present

## 2023-02-16 LAB — URINALYSIS, W/ REFLEX TO CULTURE (INFECTION SUSPECTED)
Bilirubin Urine: NEGATIVE
Glucose, UA: NEGATIVE mg/dL
Hgb urine dipstick: NEGATIVE
Ketones, ur: NEGATIVE mg/dL
Leukocytes,Ua: NEGATIVE
Nitrite: NEGATIVE
Protein, ur: NEGATIVE mg/dL
Specific Gravity, Urine: 1.025 (ref 1.005–1.030)
pH: 5.5 (ref 5.0–8.0)

## 2023-02-16 LAB — CBC WITH DIFFERENTIAL/PLATELET
Abs Immature Granulocytes: 0.18 10*3/uL — ABNORMAL HIGH (ref 0.00–0.07)
Basophils Absolute: 0 10*3/uL (ref 0.0–0.1)
Basophils Relative: 0 %
Eosinophils Absolute: 0.1 10*3/uL (ref 0.0–0.5)
Eosinophils Relative: 1 %
HCT: 40.5 % (ref 39.0–52.0)
Hemoglobin: 14 g/dL (ref 13.0–17.0)
Immature Granulocytes: 1 %
Lymphocytes Relative: 5 %
Lymphs Abs: 1.1 10*3/uL (ref 0.7–4.0)
MCH: 29.9 pg (ref 26.0–34.0)
MCHC: 34.6 g/dL (ref 30.0–36.0)
MCV: 86.5 fL (ref 80.0–100.0)
Monocytes Absolute: 1.6 10*3/uL — ABNORMAL HIGH (ref 0.1–1.0)
Monocytes Relative: 8 %
Neutro Abs: 17.4 10*3/uL — ABNORMAL HIGH (ref 1.7–7.7)
Neutrophils Relative %: 85 %
Platelets: 376 10*3/uL (ref 150–400)
RBC: 4.68 MIL/uL (ref 4.22–5.81)
RDW: 12 % (ref 11.5–15.5)
WBC: 20.4 10*3/uL — ABNORMAL HIGH (ref 4.0–10.5)
nRBC: 0 % (ref 0.0–0.2)

## 2023-02-16 LAB — BASIC METABOLIC PANEL
Anion gap: 14 (ref 5–15)
BUN: 9 mg/dL (ref 6–20)
CO2: 23 mmol/L (ref 22–32)
Calcium: 9.2 mg/dL (ref 8.9–10.3)
Chloride: 99 mmol/L (ref 98–111)
Creatinine, Ser: 0.95 mg/dL (ref 0.61–1.24)
GFR, Estimated: >60 mL/min (ref >60)
Glucose, Bld: 160 mg/dL — ABNORMAL HIGH (ref 70–99)
Potassium: 4.4 mmol/L (ref 3.5–5.1)
Sodium: 136 mmol/L (ref 135–145)

## 2023-02-16 MED ORDER — HYDROMORPHONE HCL 1 MG/ML IJ SOLN
1.0000 mg | Freq: Once | INTRAMUSCULAR | Status: AC
  Administered 2023-02-16: 1 mg via INTRAVENOUS
  Filled 2023-02-16: qty 1

## 2023-02-16 NOTE — ED Provider Notes (Signed)
 [facility] Provider Note   CSN: [id number] Arrival date & time: 02/16/23  0546     History  Chief Complaint  Patient presents with   Back Pain    Craig Benson is a 53 y.o. male.  HPI     History lumbar laminectomy 12/23  Reports that prior to the surgery he was having right leg numbness, and since he had the surgery though symptoms had improved.  Initially his pain had improved following the surgery and he stopped taking medications for pain approximately 5 days after, or last Friday and suddenly this morning developed worsening pain.  It was severe located to the right of his incision.  It was worse with movement.  He reports associated nausea with the pain.  Denies any fever, chills, vomiting, diarrhea, constipation, abdominal pain, dysuria, urinary retention, incontinence or stool incontinence.  Denies any new numbness or weakness of his lower extremities.  Reports severe and new worsening pain  Past Medical History:  Diagnosis Date   GAD (generalized anxiety disorder)    Genital herpes     Home Medications Prior to Admission medications   Medication Sig Start Date End Date Taking? Authorizing Provider  allopurinol  (ZYLOPRIM ) 100 MG tablet Take 1 tablet (100 mg total) by mouth daily. 07/23/22   Katrinka Garnette KIDD, MD  citalopram  (CELEXA ) 40 MG tablet Take 1 tablet (40 mg total) by mouth daily. 07/23/22   Katrinka Garnette KIDD, MD      Allergies    Penicillins, Amoxicillin, and Clindamycin hcl    Review of Systems   Review of Systems  Physical Exam Updated Vital Signs BP 113/77   Pulse 100   Temp 98.3 F (36.8 C)   Resp 18   Ht 5' 8 (1.727 m)   Wt 72.6 kg   SpO2 100%   BMI 24.33 kg/m  Physical Exam Vitals and nursing note reviewed.  Constitutional:      General: He is not in acute distress.    Appearance: He is well-developed. He is not diaphoretic.  HENT:     Head: Normocephalic and atraumatic.  Eyes:      Conjunctiva/sclera: Conjunctivae normal.  Cardiovascular:     Rate and Rhythm: Normal rate and regular rhythm.     Heart sounds: Normal heart sounds. No murmur heard.    No friction rub. No gallop.  Pulmonary:     Effort: Pulmonary effort is normal. No respiratory distress.     Breath sounds: Normal breath sounds. No wheezing or rales.  Abdominal:     General: There is no distension.     Palpations: Abdomen is soft.     Tenderness: There is no abdominal tenderness. There is no guarding.  Musculoskeletal:        General: Swelling (swelling superior portion of incision, erythema, drainage yellow serous on gauze, no purulence expressed) and tenderness (right paraspinal, tenderness around incision) present.     Cervical back: Normal range of motion.  Skin:    General: Skin is warm and dry.  Neurological:     Mental Status: He is alert and oriented to person, place, and time.     Sensory: No sensory deficit.     Motor: No weakness.     ED Results / Procedures / Treatments   Labs (all labs ordered are listed, but only abnormal results are displayed) Labs Reviewed  BASIC METABOLIC PANEL - Abnormal; Notable for the following components:      Result Value  Glucose, Bld 160 (*)    All other components within normal limits  CBC WITH DIFFERENTIAL/PLATELET - Abnormal; Notable for the following components:   WBC 20.4 (*)    Neutro Abs 17.4 (*)    Monocytes Absolute 1.6 (*)    Abs Immature Granulocytes 0.18 (*)    All other components within normal limits  URINALYSIS, W/ REFLEX TO CULTURE (INFECTION SUSPECTED) - Abnormal; Notable for the following components:   Bacteria, UA RARE (*)    All other components within normal limits    EKG None  Radiology No results found.  Procedures Procedures    Medications Ordered in ED Medications  HYDROmorphone  (DILAUDID ) injection 1 mg (1 mg Intravenous Given 02/16/23 0651)  HYDROmorphone  (DILAUDID ) injection 1 mg (1 mg Intravenous Given  02/16/23 1055)    ED Course/ Medical Decision Making/ A&P                                   52yo male with history of laminectomy L5-S1 with Dr. Daune 12/23 who presents with concern for newly increasing back pain, drainage from site.  Has no acute neurologic deficits, no abdominal pain or tenderness, no urinary symptoms. No signs of cauda equina.  Superior portion of the incision does have some erythema, has had some drainage since the surgery which per patient history sounds more serous.   Labs obtained and personally about interpreted by me are significant for leukocytosis of 20,000.  No clinically significant electrolyte abnormalities.  UA without infection.  Given concern for possible infection with increasing pain, 20,000WBC, drainage, discussed with Dr. Janan on call for [facility]. Will transfer to [facility] for further evaluation.          Final Clinical Impression(s) / ED Diagnoses Final diagnoses:  Acute right-sided low back pain without sciatica  Leukocytosis, unspecified type    Rx / DC Orders ED Discharge Orders     None         Dreama Longs, MD 02/17/23 [phone]

## 2023-02-16 NOTE — ED Notes (Signed)
 Called after hours for [facility] [phone] at 7:30a Pt had surgery at location last Monday and dr requested consult with practice on call

## 2023-02-16 NOTE — Discharge Instructions (Signed)
 Go to the ED at [facility] for further evaluation of your increasing back pain, high white blood cell count, drainage from your incision.

## 2023-02-16 NOTE — ED Triage Notes (Signed)
 Sx on lower back 1 week ago Monday, was sitting watching tv and something happened developed lower back pain. Tried 10 mg oxycodone 0200 and muscle relaxer and tylenol at 0500

## 2023-02-16 NOTE — ED Notes (Addendum)
 Called [facility] transfer line for patient for transfer ed to ed possibly for mri at [facility] hospital spoke with [facility]

## 2023-02-16 NOTE — ED Provider Triage Note (Signed)
 Emergency Medicine Provider Triage Evaluation Note  Karem Tomaso , a 53 y.o. male  was evaluated in triage.  Pt complains of back pain.  He has acute right side low back pain that started while laying on the couch.  He had lumbar decompression surgery a week ago Friday.  No fevers, chills..  Review of Systems  Positive: Back pain Negative: Fevers, chills  Physical Exam  BP 119/78   Pulse (!) 107   Temp 98.3 F (36.8 C)   Resp 20   Ht 5' 8 (1.727 m)   Wt 72.6 kg   SpO2 100%   BMI 24.33 kg/m  Gen:   Awake, appears uncomfortable Resp:  Normal effort  MSK:   Moves extremities without difficulty      Medical Decision Making  Medically screening exam initiated at 6:43 AM.  Appropriate orders placed.  Malacai Grantz was informed that the remainder of the evaluation will be completed by another provider, this initial triage assessment does not replace that evaluation, and the importance of remaining in the ED until their evaluation is complete.     Griselda Norris, MD 02/16/23 [phone]

## 2023-02-16 NOTE — ED Notes (Addendum)
 [facility] is accepting pt to ED ortho surgeon Dr Sherlyn Lick accepting OBS Room 3317 in Ed pt going POV  Pt changed mind about going by [facility] so called [facility] back at 10:05a spoke with [facility] for transport again  call report at [phone]

## 2023-03-19 ENCOUNTER — Other Ambulatory Visit (INDEPENDENT_AMBULATORY_CARE_PROVIDER_SITE_OTHER): Payer: 59

## 2023-03-19 ENCOUNTER — Encounter: Payer: Self-pay | Admitting: Family Medicine

## 2023-03-19 DIAGNOSIS — M1A0790 Idiopathic chronic gout, unspecified ankle and foot, without tophus (tophi): Secondary | ICD-10-CM

## 2023-03-19 DIAGNOSIS — E785 Hyperlipidemia, unspecified: Secondary | ICD-10-CM | POA: Diagnosis not present

## 2023-03-19 LAB — COMPREHENSIVE METABOLIC PANEL
ALT: 10 U/L (ref 0–53)
AST: 17 U/L (ref 0–37)
Albumin: 4 g/dL (ref 3.5–5.2)
Alkaline Phosphatase: 58 U/L (ref 39–117)
BUN: 11 mg/dL (ref 6–23)
CO2: 27 meq/L (ref 19–32)
Calcium: 9.4 mg/dL (ref 8.4–10.5)
Chloride: 104 meq/L (ref 96–112)
Creatinine, Ser: 0.89 mg/dL (ref 0.40–1.50)
GFR: 98.77 mL/min (ref >60.00)
Glucose, Bld: 103 mg/dL — ABNORMAL HIGH (ref 70–99)
Potassium: 3.8 meq/L (ref 3.5–5.1)
Sodium: 138 meq/L (ref 135–145)
Total Bilirubin: 0.6 mg/dL (ref 0.2–1.2)
Total Protein: 6.8 g/dL (ref 6.0–8.3)

## 2023-03-19 LAB — CBC WITH DIFFERENTIAL/PLATELET
Basophils Absolute: 0 10*3/uL (ref 0.0–0.1)
Basophils Relative: 0.9 % (ref 0.0–3.0)
Eosinophils Absolute: 0.3 10*3/uL (ref 0.0–0.7)
Eosinophils Relative: 7 % — ABNORMAL HIGH (ref 0.0–5.0)
HCT: 40.9 % (ref 39.0–52.0)
Hemoglobin: 13.7 g/dL (ref 13.0–17.0)
Lymphocytes Relative: 34.5 % (ref 12.0–46.0)
Lymphs Abs: 1.6 10*3/uL (ref 0.7–4.0)
MCHC: 33.6 g/dL (ref 30.0–36.0)
MCV: 88.8 fL (ref 78.0–100.0)
Monocytes Absolute: 0.5 10*3/uL (ref 0.1–1.0)
Monocytes Relative: 10.6 % (ref 3.0–12.0)
Neutro Abs: 2.2 10*3/uL (ref 1.4–7.7)
Neutrophils Relative %: 47 % (ref 43.0–77.0)
Platelets: 196 10*3/uL (ref 150.0–400.0)
RBC: 4.6 Mil/uL (ref 4.22–5.81)
RDW: 14.7 % (ref 11.5–15.5)
WBC: 4.7 10*3/uL (ref 4.0–10.5)

## 2023-03-19 LAB — LIPID PANEL
Cholesterol: 209 mg/dL — ABNORMAL HIGH (ref 0–200)
HDL: 47.6 mg/dL (ref >39.00)
LDL Cholesterol: 135 mg/dL — ABNORMAL HIGH (ref 0–99)
NonHDL: 161.49
Total CHOL/HDL Ratio: 4
Triglycerides: 130 mg/dL (ref 0.0–149.0)
VLDL: 26 mg/dL (ref 0.0–40.0)

## 2023-03-19 LAB — URIC ACID: Uric Acid, Serum: 6.9 mg/dL (ref 4.0–7.8)

## 2023-03-21 ENCOUNTER — Other Ambulatory Visit: Payer: Self-pay

## 2023-03-21 MED ORDER — ALLOPURINOL 200 MG PO TABS: 100.0000 mg | ORAL_TABLET | Freq: Every day | ORAL | 3 refills | Status: DC

## 2023-04-02 ENCOUNTER — Encounter: Payer: Self-pay | Admitting: Family Medicine

## 2023-04-03 MED ORDER — ALLOPURINOL 200 MG PO TABS: 200.0000 mg | ORAL_TABLET | Freq: Every day | ORAL | 3 refills | Status: DC

## 2023-05-09 ENCOUNTER — Telehealth: Payer: Self-pay

## 2023-05-09 NOTE — Telephone Encounter (Signed)
 Called and spoke with pt and made him aware that we did send in #90 on 02/19. Pt states the pharmacy only gave him #45 and I told him this sounds like an insurance issue so to reach out to his insurance to see why they only allow #45.

## 2023-05-09 NOTE — Telephone Encounter (Signed)
 Patient called, left VM to return the call to the office. Let patient know Allopurinol 200 mg prescription was sent to the pharmacy on 04/03/23 to take when he runs out of the 100 mg.   Copied from CRM [id number]. Topic: Clinical - Medication Question >> May 09, 2023  2:22 PM Adrionna Y wrote: Reason for CRM:  The patient would like a call back about what to do because his medication, Allopurinol, was changed from 100 mg to 200mg  and if he took it as he was told to, he would run out sooner, and he would not have any refills on the medication.   Patient would like to know what to do

## 2023-05-10 ENCOUNTER — Other Ambulatory Visit: Payer: Self-pay

## 2023-05-10 ENCOUNTER — Telehealth: Payer: Self-pay

## 2023-05-10 MED ORDER — ALLOPURINOL 200 MG PO TABS: 200.0000 mg | ORAL_TABLET | Freq: Every day | ORAL | 3 refills | Status: AC

## 2023-05-10 NOTE — Telephone Encounter (Signed)
 The sig on the rx is correct, I have recent previous rx.  Copied from CRM [id number]. Topic: Clinical - Prescription Issue >> Craig Benson, Craig Benson  3:49 PM Craig Benson wrote: Reason for CRM: Patient stated he needs the medication instructions to list 1 pill instead of half of pill to receive his 90. Stated he's reached out to insurance and they called pharmacy to confirm that they have 90 instead of  45 If needed, please call  [phone]

## 2023-07-25 ENCOUNTER — Encounter: Payer: 59 | Admitting: Family Medicine

## 2023-08-31 ENCOUNTER — Other Ambulatory Visit: Payer: Self-pay | Admitting: Family Medicine

## 2023-11-26 ENCOUNTER — Encounter: Admitting: Family Medicine

## 2024-02-28 ENCOUNTER — Encounter: Admitting: Family Medicine

## 2024-03-24 ENCOUNTER — Ambulatory Visit: Payer: Self-pay | Admitting: Family Medicine

## 2024-06-12 ENCOUNTER — Encounter: Admitting: Family Medicine
